# Patient Record
Sex: Female | Born: 1995 | Race: White | Hispanic: No | Marital: Single | Smoking: Never smoker
Health system: Southern US, Community
[De-identification: ages and names within clinical notes are randomized; demographics above are authoritative.]

## PROBLEM LIST (undated history)

## (undated) ENCOUNTER — Inpatient Hospital Stay (HOSPITAL_COMMUNITY): Payer: Self-pay

## (undated) DIAGNOSIS — R011 Cardiac murmur, unspecified: Secondary | ICD-10-CM

## (undated) DIAGNOSIS — M7070 Other bursitis of hip, unspecified hip: Secondary | ICD-10-CM

## (undated) DIAGNOSIS — D649 Anemia, unspecified: Secondary | ICD-10-CM

## (undated) HISTORY — PX: TONSILLECTOMY: SUR1361

## (undated) HISTORY — DX: Cardiac murmur, unspecified: R01.1

## (undated) HISTORY — DX: Anemia, unspecified: D64.9

## (undated) HISTORY — DX: Other bursitis of hip, unspecified hip: M70.70

---
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
## 2016-07-04 DIAGNOSIS — Z86718 Personal history of other venous thrombosis and embolism: Secondary | ICD-10-CM | POA: Insufficient documentation

## 2020-04-16 ENCOUNTER — Encounter (HOSPITAL_COMMUNITY): Payer: Self-pay

## 2020-04-16 ENCOUNTER — Other Ambulatory Visit: Payer: Self-pay

## 2020-04-16 ENCOUNTER — Emergency Department (HOSPITAL_COMMUNITY): Payer: Medicaid Other

## 2020-04-16 DIAGNOSIS — R0600 Dyspnea, unspecified: Secondary | ICD-10-CM | POA: Diagnosis not present

## 2020-04-16 DIAGNOSIS — R002 Palpitations: Secondary | ICD-10-CM | POA: Insufficient documentation

## 2020-04-16 DIAGNOSIS — R079 Chest pain, unspecified: Secondary | ICD-10-CM | POA: Diagnosis not present

## 2020-04-16 LAB — I-STAT BETA HCG BLOOD, ED (NOT ORDERABLE): I-stat hCG, quantitative: <5 m[IU]/mL (ref <5)

## 2020-04-16 NOTE — ED Triage Notes (Signed)
Pt is post partum 6wks. Last week pt began feeling dizzines, lightheaded, and chest heaviness, and palpations. Pt went to urgent care and was sent here.

## 2020-04-17 ENCOUNTER — Emergency Department (HOSPITAL_COMMUNITY)
Admission: EM | Admit: 2020-04-17 | Discharge: 2020-04-17 | Disposition: A | Discharge status: Home / Self Care | Payer: Medicaid Other | Attending: Emergency Medicine | Admitting: Emergency Medicine

## 2020-04-17 DIAGNOSIS — R0789 Other chest pain: Secondary | ICD-10-CM

## 2020-04-17 LAB — CBC
HCT: 41.9 % (ref 36.0–46.0)
Hemoglobin: 12.7 g/dL (ref 12.0–15.0)
MCH: 26.4 pg (ref 26.0–34.0)
MCHC: 30.3 g/dL (ref 30.0–36.0)
MCV: 87.1 fL (ref 80.0–100.0)
Platelets: 222 10*3/uL (ref 150–400)
RBC: 4.81 MIL/uL (ref 3.87–5.11)
RDW: 15.1 % (ref 11.5–15.5)
WBC: 7.9 10*3/uL (ref 4.0–10.5)
nRBC: 0 % (ref 0.0–0.2)

## 2020-04-17 LAB — BASIC METABOLIC PANEL
Anion gap: 11 (ref 5–15)
BUN: 7 mg/dL (ref 6–20)
CO2: 25 mmol/L (ref 22–32)
Calcium: 9.8 mg/dL (ref 8.9–10.3)
Chloride: 104 mmol/L (ref 98–111)
Creatinine, Ser: 0.63 mg/dL (ref 0.44–1.00)
GFR calc Af Amer: >60 mL/min (ref >60)
GFR calc non Af Amer: >60 mL/min (ref >60)
Glucose, Bld: 98 mg/dL (ref 70–99)
Potassium: 3.9 mmol/L (ref 3.5–5.1)
Sodium: 140 mmol/L (ref 135–145)

## 2020-04-17 LAB — TROPONIN I (HIGH SENSITIVITY): Troponin I (High Sensitivity): 5 ng/L (ref <18)

## 2020-04-17 MED ORDER — ALBUTEROL SULFATE HFA 108 (90 BASE) MCG/ACT IN AERS
2.0000 | INHALATION_SPRAY | RESPIRATORY_TRACT | Status: DC
  Administered 2020-04-17: 2 via RESPIRATORY_TRACT
  Filled 2020-04-17: qty 6.7

## 2020-04-17 NOTE — Discharge Instructions (Addendum)
Take 1 to 2 puffs of the inhaler every 4-6 hours as needed for any trouble breathing

## 2020-04-17 NOTE — ED Provider Notes (Signed)
[facility]-EMERGENCY DEPT Provider Note   CSN: [id number] Arrival date & time: 04/16/20  1921     History Chief Complaint  Patient presents with  . Chest Pain    Marilyn Howard is a 24 y.o. female.  24 year old female presents with several week history of chest discomfort.  Chest discomfort wax and wanes and is positional.  Has been associate with palpitations at times.  Denies any associated leg pain or swelling.  No recent fever or chills.  No cough or congestion.  Went to urgent care and that note was reviewed.  Patient is does note some increased anxiety when she has the symptoms.  Patient also notes that she at times has trouble breathing and does have a history of vape use as well as tobacco.  States that she use inhaler one time which did not make her symptoms better.        History reviewed. No pertinent past medical history.  There are no problems to display for this patient.   History reviewed. No pertinent surgical history.   OB History   No obstetric history on file.     No family history on file.  Social History   Tobacco Use  . Smoking status: Not on file  Substance Use Topics  . Alcohol use: Not on file  . Drug use: Not on file    Home Medications Prior to Admission medications   Not on File    Allergies    Patient has no allergy information on record.  Review of Systems   Review of Systems  All other systems reviewed and are negative.   Physical Exam Updated Vital Signs BP 122/73 (BP Location: Right Arm)   Pulse 75   Temp 98.5 F (36.9 C) (Oral)   Resp 18   Ht 1.702 m (5\' 7" )   Wt 81.6 kg   LMP 04/02/2020   SpO2 100%   BMI 28.19 kg/m   Physical Exam Vitals and nursing note reviewed.  Constitutional:      General: She is not in acute distress.    Appearance: Normal appearance. She is well-developed. She is not toxic-appearing.  HENT:     Head: Normocephalic and atraumatic.  Eyes:     General: Lids are  normal.     Conjunctiva/sclera: Conjunctivae normal.     Pupils: Pupils are equal, round, and reactive to light.  Neck:     Thyroid: No thyroid mass.     Trachea: No tracheal deviation.  Cardiovascular:     Rate and Rhythm: Normal rate and regular rhythm.     Heart sounds: Normal heart sounds. No murmur heard.  No gallop.   Pulmonary:     Effort: Pulmonary effort is normal. No respiratory distress.     Breath sounds: Normal breath sounds. No stridor. No decreased breath sounds, wheezing, rhonchi or rales.  Abdominal:     General: Bowel sounds are normal. There is no distension.     Palpations: Abdomen is soft.     Tenderness: There is no abdominal tenderness. There is no rebound.  Musculoskeletal:        General: No tenderness. Normal range of motion.     Cervical back: Normal range of motion and neck supple.  Skin:    General: Skin is warm and dry.     Findings: No abrasion or rash.  Neurological:     Mental Status: She is alert and oriented to person, place, and time.  GCS: GCS eye subscore is 4. GCS verbal subscore is 5. GCS motor subscore is 6.     Cranial Nerves: No cranial nerve deficit.     Sensory: No sensory deficit.  Psychiatric:        Speech: Speech normal.        Behavior: Behavior normal.     ED Results / Procedures / Treatments   Labs (all labs ordered are listed, but only abnormal results are displayed) Labs Reviewed  BASIC METABOLIC PANEL  CBC  I-STAT BETA HCG BLOOD, ED ([facility], [facility], [facility] ONLY)  I-STAT BETA HCG BLOOD, ED (NOT ORDERABLE)  TROPONIN I (HIGH SENSITIVITY)  TROPONIN I (HIGH SENSITIVITY)    EKG EKG Interpretation  Date/Time:  Monday April 16 2020 20:53:38 EDT Ventricular Rate:  91 PR Interval:  152 QRS Duration: 82 QT Interval:  354 QTC Calculation: 435 R Axis:   82 Text Interpretation: Normal sinus rhythm Normal ECG Confirmed by Lorre Nick ([id number]) on 04/17/2020 8:35:41 AM   Radiology DG Chest 2 View  Result Date:  04/16/2020 CLINICAL DATA:  Mid chest pain. Heart palpitations. Shortness of breath. Dizziness. The symptoms have been intermittent over the last year and worsening over the past week. EXAM: CHEST - 2 VIEW COMPARISON:  None. FINDINGS: The heart size and mediastinal contours are within normal limits. Both lungs are clear. The visualized skeletal structures are unremarkable. IMPRESSION: Normal examination. Electronically Signed   By: Beckie Salts M.D.   On: 04/16/2020 21:29    Procedures Procedures (including critical care time)  Medications Ordered in ED Medications - No data to display  ED Course  I have reviewed the triage vital signs and the nursing notes.  Pertinent labs & imaging results that were available during my care of the patient were reviewed by me and considered in my medical decision making (see chart for details).    MDM Rules/Calculators/A&P                          Patient's evaluation here including EKG, chest x-rays, labs are within normal limits.  Patient is requesting butyryl inhaler which I will provide.  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  low suspicion for ACS or PE.   Return precautions given   Final Clinical Impression(s) / ED Diagnoses Final diagnoses:  None    Rx / DC Orders ED Discharge Orders    None       Lorre Nick, MD 04/17/20 [phone]

## 2020-04-17 NOTE — ED Notes (Signed)
Called main lab to inquire about blood sent down at 2100 hours that was not in process Main lab found blood and will start processing labs

## 2020-05-04 DIAGNOSIS — F411 Generalized anxiety disorder: Secondary | ICD-10-CM | POA: Insufficient documentation

## 2021-09-05 IMAGING — CR DG CHEST 2V
2 series · 2 of 2 positions shown · non-contrast
Comparison: None.

CLINICAL DATA: Mid chest pain. Heart palpitations. Shortness of
breath. Dizziness. The symptoms have been intermittent over the last
year and worsening over the past week.

EXAM:
CHEST - 2 VIEW

[w chest pa]
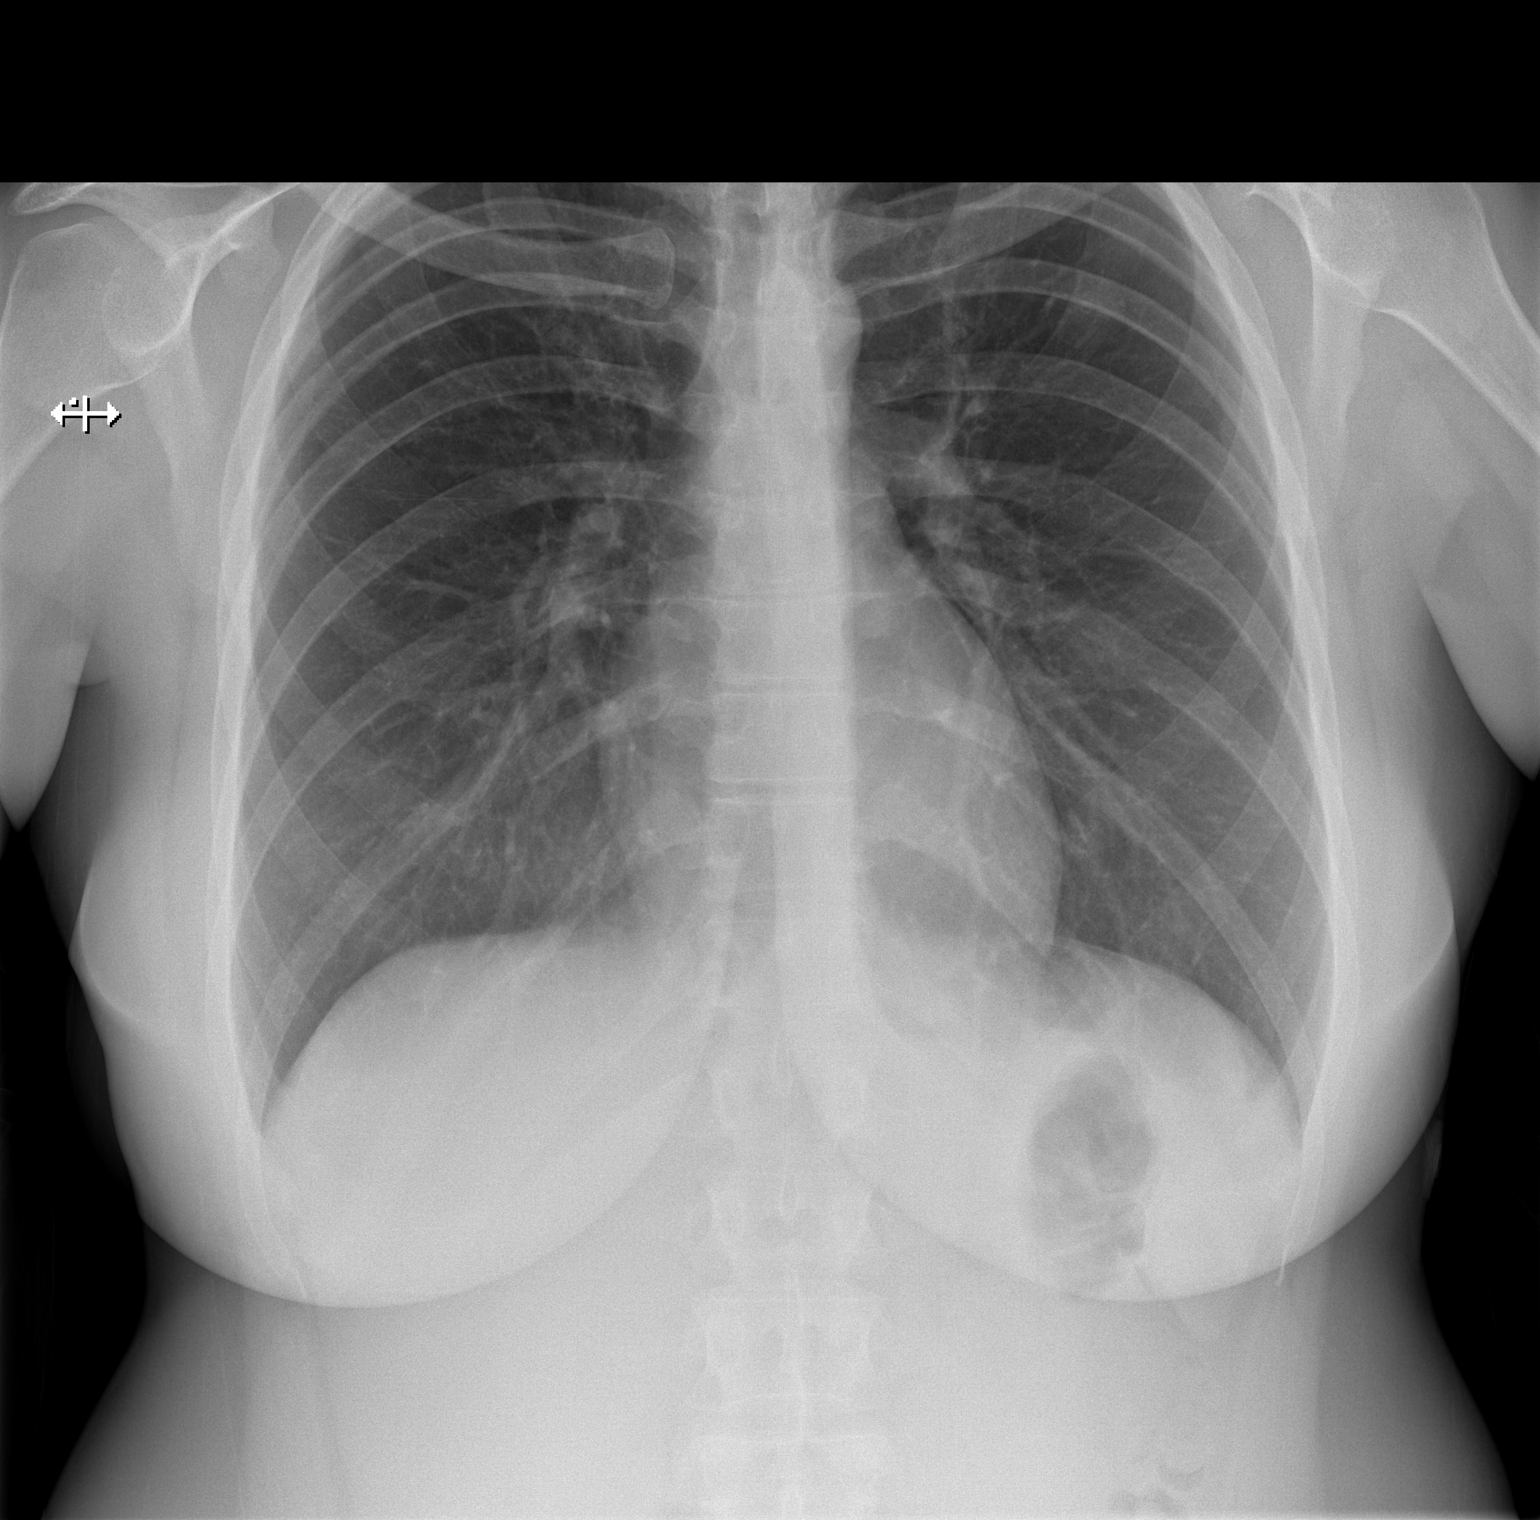

[w chest lat]
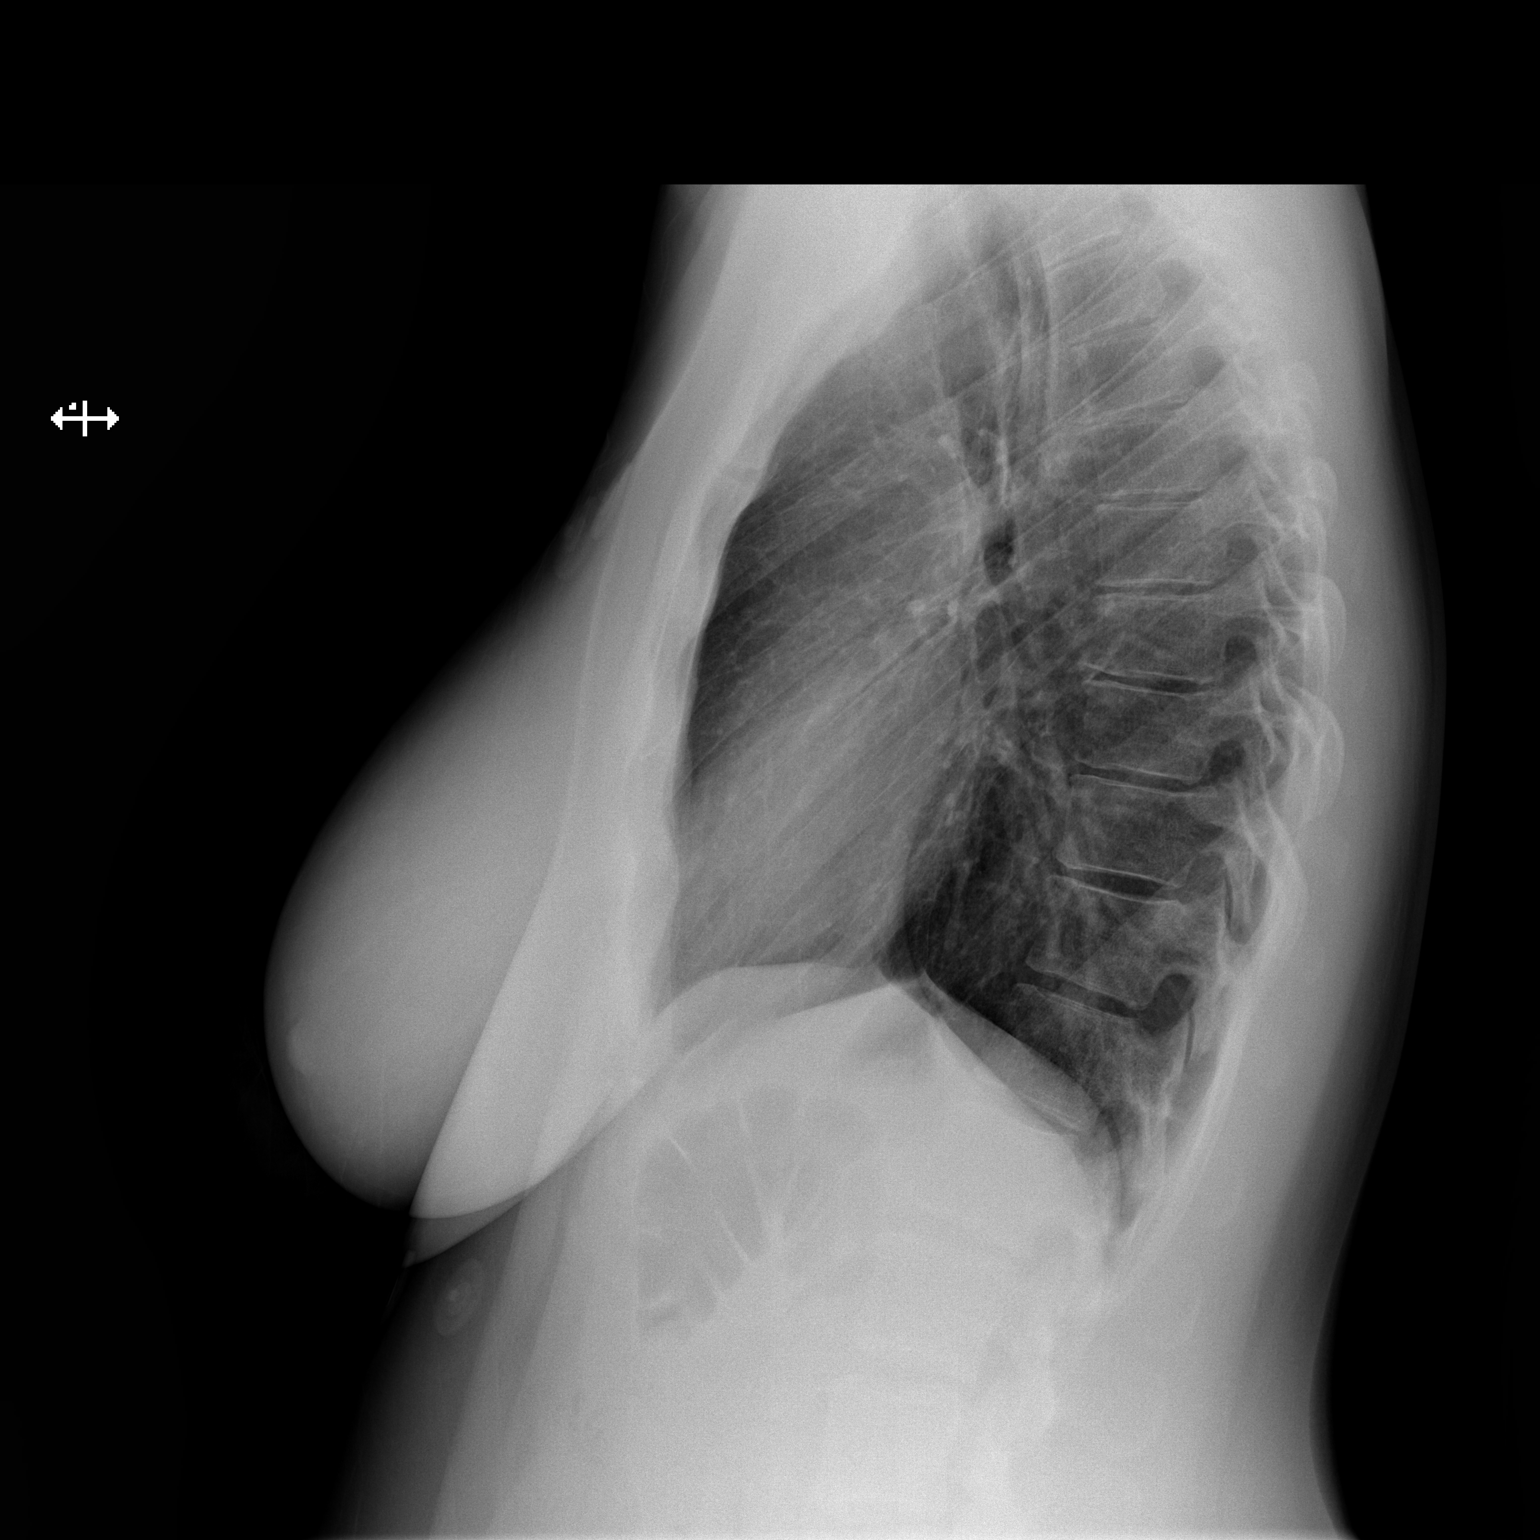

[2 of 2 positions shown; findings below may reference images not displayed]

FINDINGS: The heart size and mediastinal contours are within normal limits.
Both lungs are clear. The visualized skeletal structures are
unremarkable.
IMPRESSION: Normal examination.

## 2022-02-24 DIAGNOSIS — G4710 Hypersomnia, unspecified: Secondary | ICD-10-CM | POA: Insufficient documentation

## 2022-02-24 DIAGNOSIS — E559 Vitamin D deficiency, unspecified: Secondary | ICD-10-CM | POA: Insufficient documentation

## 2022-09-21 ENCOUNTER — Other Ambulatory Visit: Payer: Self-pay

## 2022-09-21 ENCOUNTER — Emergency Department (HOSPITAL_COMMUNITY): Payer: Medicaid Other

## 2022-09-21 ENCOUNTER — Emergency Department (HOSPITAL_COMMUNITY)
Admission: EM | Admit: 2022-09-21 | Discharge: 2022-09-21 | Disposition: A | Discharge status: Home / Self Care | Payer: Medicaid Other | Attending: Emergency Medicine | Admitting: Emergency Medicine

## 2022-09-21 DIAGNOSIS — W01198A Fall on same level from slipping, tripping and stumbling with subsequent striking against other object, initial encounter: Secondary | ICD-10-CM | POA: Insufficient documentation

## 2022-09-21 DIAGNOSIS — S0502XA Injury of conjunctiva and corneal abrasion without foreign body, left eye, initial encounter: Secondary | ICD-10-CM | POA: Diagnosis not present

## 2022-09-21 DIAGNOSIS — S82142A Displaced bicondylar fracture of left tibia, initial encounter for closed fracture: Secondary | ICD-10-CM | POA: Diagnosis not present

## 2022-09-21 DIAGNOSIS — S8992XA Unspecified injury of left lower leg, initial encounter: Secondary | ICD-10-CM | POA: Diagnosis present

## 2022-09-21 MED ORDER — KETOROLAC TROMETHAMINE 30 MG/ML IJ SOLN
30.0000 mg | Freq: Once | INTRAMUSCULAR | Status: AC
  Administered 2022-09-21: 30 mg via INTRAMUSCULAR
  Filled 2022-09-21: qty 1

## 2022-09-21 MED ORDER — OXYCODONE-ACETAMINOPHEN 5-325 MG PO TABS: 1.0000 | ORAL_TABLET | Freq: Four times a day (QID) | ORAL | 0 refills | Status: DC | PRN

## 2022-09-21 MED ORDER — OXYCODONE-ACETAMINOPHEN 5-325 MG PO TABS: 1.0000 | ORAL_TABLET | ORAL | 0 refills | Status: DC | PRN

## 2022-09-21 MED ORDER — OXYCODONE-ACETAMINOPHEN 5-325 MG PO TABS
1.0000 | ORAL_TABLET | Freq: Once | ORAL | Status: AC
  Administered 2022-09-21: 1 via ORAL
  Filled 2022-09-21: qty 1

## 2022-09-21 NOTE — ED Provider Notes (Signed)
[facility] Provider Note   CSN: [id number] Arrival date & time: 09/21/22  0009     History  Chief Complaint  Patient presents with   Knee Injury    Marilyn Howard is a 27 y.o. female.  Patient presents to the emergency department for evaluation of left knee injury after a fall.  Patient reports that she slipped and her left knee buckled up underneath her.  She is having severe pain in the knee, especially when she tries to bear weight.  She did hit her head and has a scratch on her left eyebrow but no loss of consciousness.  No headache, neck pain, back pain.       Home Medications Prior to Admission medications   Medication Sig Start Date End Date Taking? Authorizing Provider  oxyCODONE-acetaminophen (PERCOCET) 5-325 MG tablet Take 1 tablet by mouth every 4 (four) hours as needed for severe pain. 09/21/22  Yes Samarrah Tranchina, Gwenyth Allegra, MD  oxyCODONE-acetaminophen (PERCOCET/ROXICET) 5-325 MG tablet Take 1 tablet by mouth every 6 (six) hours as needed for severe pain. 09/21/22  Yes Marleena Shubert, Gwenyth Allegra, MD      Allergies    Patient has no allergy information on record.    Review of Systems   Review of Systems  Physical Exam Updated Vital Signs BP 125/70   Pulse 85   Temp 98.1 F (36.7 C) (Oral)   Resp 18   Ht 5\' 7"  (1.702 m)   Wt 81 kg   SpO2 100%   BMI 27.97 kg/m  Physical Exam Vitals and nursing note reviewed.  Constitutional:      General: She is not in acute distress.    Appearance: She is well-developed.  HENT:     Head: Normocephalic. Abrasion (Superficial, small left eyebrow) present.     Mouth/Throat:     Mouth: Mucous membranes are moist.  Eyes:     General: Vision grossly intact. Gaze aligned appropriately.     Extraocular Movements: Extraocular movements intact.     Conjunctiva/sclera: Conjunctivae normal.  Cardiovascular:     Rate and Rhythm: Normal rate and regular rhythm.     Pulses: Normal pulses.     Heart sounds:  Normal heart sounds, S1 normal and S2 normal. No murmur heard.    No friction rub. No gallop.  Pulmonary:     Effort: Pulmonary effort is normal. No respiratory distress.     Breath sounds: Normal breath sounds.  Abdominal:     General: Bowel sounds are normal.     Palpations: Abdomen is soft.     Tenderness: There is no abdominal tenderness. There is no guarding or rebound.     Hernia: No hernia is present.  Musculoskeletal:        General: No swelling.     Cervical back: Full passive range of motion without pain, normal range of motion and neck supple. No spinous process tenderness or muscular tenderness. Normal range of motion.     Left knee: Effusion present. Decreased range of motion. Tenderness present.     Right lower leg: No edema.     Left lower leg: No edema.  Skin:    General: Skin is warm and dry.     Capillary Refill: Capillary refill takes less than 2 seconds.     Findings: No ecchymosis, erythema, rash or wound.  Neurological:     General: No focal deficit present.     Mental Status: She is alert and oriented to person, place,  and time.     GCS: GCS eye subscore is 4. GCS verbal subscore is 5. GCS motor subscore is 6.     Cranial Nerves: Cranial nerves 2-12 are intact.     Sensory: Sensation is intact.     Motor: Motor function is intact.     Coordination: Coordination is intact.  Psychiatric:        Attention and Perception: Attention normal.        Mood and Affect: Mood normal.        Speech: Speech normal.        Behavior: Behavior normal.     ED Results / Procedures / Treatments   Labs (all labs ordered are listed, but only abnormal results are displayed) Labs Reviewed - No data to display  EKG None  Radiology CT Knee Left Wo Contrast  Result Date: 09/21/2022 CLINICAL DATA:  Tibial plateau fracture.  Fall injury. EXAM: CT OF THE LEFT KNEE WITHOUT CONTRAST TECHNIQUE: Multidetector CT imaging of the left knee was performed according to the standard  protocol. Multiplanar CT image reconstructions were also generated. RADIATION DOSE REDUCTION: This exam was performed according to the departmental dose-optimization program which includes automated exposure control, adjustment of the mA and/or kV according to patient size and/or use of iterative reconstruction technique. COMPARISON:  Left knee series earlier today. FINDINGS: Bones/Joint/Cartilage There is normal bone mineralization. There is a slightly depressed roughly ovoid shaped acute fracture fragment of the central to lateral aspect of the lateral tibial plateau, depressed no more than 2 mm from the plane of the articular surface. The fracture fragment measures 2.1 cm coronal, 2 cm AP, and 1 cm in height and there is no fracture line exiting the metaphyseal cortex extending from the bed of this fracture fragment. There are no further fractures. There is a moderate lipohemarthrosis which is predominantly suprapatellar in location. There is no popliteal cyst. Arthritic changes are not seen. Ligaments Suboptimally assessed by CT. Muscles and Tendons The extensor mechanism is intact. No muscular hematoma is seen. There is normal muscle bulk. Soft tissues There is no soft tissue gas.  No soft tissue mass is seen No surface hematoma. IMPRESSION: 1. Slightly depressed fracture fragment of the lateral tibial plateau with 2 mm depression. 2. Moderate lipohemarthrosis. Electronically Signed   By: Almira Bar M.D.   On: 09/21/2022 02:15   DG Knee Complete 4 Views Left  Result Date: 09/21/2022 CLINICAL DATA:  Left knee pain, fall EXAM: LEFT KNEE - COMPLETE 4+ VIEW COMPARISON:  None Available. FINDINGS: Joint effusion with lipohemarthrosis. There is irregularity in the lateral tibial plateau concerning for fracture. Joint spaces maintained. No subluxation or dislocation. IMPRESSION: Concern for nondisplaced left lateral tibial plateau fracture with lipohemarthrosis. This could be further evaluated with CT.  Electronically Signed   By: Charlett Nose M.D.   On: 09/21/2022 01:15    Procedures Procedures    Medications Ordered in ED Medications  oxyCODONE-acetaminophen (PERCOCET/ROXICET) 5-325 MG per tablet 1 tablet (1 tablet Oral Given 09/21/22 0050)  ketorolac (TORADOL) 30 MG/ML injection 30 mg (30 mg Intramuscular Given 09/21/22 0251)    ED Course/ Medical Decision Making/ A&P                             Medical Decision Making Amount and/or Complexity of Data Reviewed Radiology: ordered and independent interpretation performed. Decision-making details documented in ED Course.  Risk Prescription drug management.   Resents to the  emergency department for evaluation of left knee injury after a fall.  Patient with swelling and palpable joint effusion on exam.  No clear deformity.  X-ray raised concern for possible tibial plateau fracture.  CT scan confirms lateral tibial plateau fracture with minimal, 2 mm depression.  Compartments are soft.  Normal neurologic function distally, distal pulses are palpable.  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  She is no longer on OCP.  Patient discussed with Dr. Lyla Glassing, on-call for Ortho.  No aspirin or blood thinners at this time.  This could lead to increased risk for compartment syndrome.  Knee immobilizer, nonweightbearing status.  Follow-up in the office this week.  Signs and symptoms of compartment syndrome discussed with the patient extensively, will return if any occur.        Final Clinical Impression(s) / ED Diagnoses Final diagnoses:  Closed fracture of left tibial plateau, initial encounter    Rx / DC Orders ED Discharge Orders          Ordered    oxyCODONE-acetaminophen (PERCOCET/ROXICET) 5-325 MG tablet  Every 6 hours PRN        09/21/22 0344    oxyCODONE-acetaminophen (PERCOCET) 5-325 MG tablet  Every 4 hours PRN        09/21/22 0346              Orpah Greek, MD 09/21/22 [phone]

## 2022-09-21 NOTE — ED Notes (Signed)
Pt given water per request.

## 2022-09-21 NOTE — ED Triage Notes (Signed)
Pt c/o left knee pain after fall. Dr. Betsey Holiday at bedside

## 2022-09-21 NOTE — ED Notes (Signed)
Patient transported to X-ray 

## 2022-09-22 MED FILL — Oxycodone w/ Acetaminophen Tab 5-325 MG: ORAL | Qty: 6 | Status: AC

## 2022-09-23 ENCOUNTER — Ambulatory Visit: Payer: Self-pay | Admitting: Orthopedic Surgery

## 2022-11-21 ENCOUNTER — Ambulatory Visit (HOSPITAL_COMMUNITY): Payer: Medicaid Other | Attending: Orthopedic Surgery

## 2022-11-21 ENCOUNTER — Encounter (HOSPITAL_COMMUNITY): Payer: Self-pay

## 2022-11-21 ENCOUNTER — Other Ambulatory Visit: Payer: Self-pay

## 2022-11-21 DIAGNOSIS — G8929 Other chronic pain: Secondary | ICD-10-CM | POA: Diagnosis present

## 2022-11-21 DIAGNOSIS — R262 Difficulty in walking, not elsewhere classified: Secondary | ICD-10-CM | POA: Diagnosis present

## 2022-11-21 DIAGNOSIS — M25662 Stiffness of left knee, not elsewhere classified: Secondary | ICD-10-CM | POA: Insufficient documentation

## 2022-11-21 DIAGNOSIS — M25562 Pain in left knee: Secondary | ICD-10-CM | POA: Insufficient documentation

## 2022-11-21 NOTE — Therapy (Signed)
OUTPATIENT PHYSICAL THERAPY LOWER EXTREMITY EVALUATION   Patient Name: Marilyn Howard MRN: [id number] DOB:[birth year], 27 y.o., female Today's Date: 11/21/2022  END OF SESSION:  PT End of Session - 11/21/22 1634     Visit Number 1    Number of Visits 8    Date for PT Re-Evaluation 12/19/22    Authorization Type [place] [employer] (requested 8 visits)    PT Start Time 1305    PT Stop Time 1350    PT Time Calculation (min) 45 min    Activity Tolerance Patient tolerated treatment well    Behavior During Therapy WFL for tasks assessed/performed            Past Medical History:  Diagnosis Date   Hip bursitis    History reviewed. No pertinent surgical history. There are no problems to display for this patient.  PCP: Rod Can, MD  REFERRING PROVIDER: Rod Can, MD  REFERRING DIAG: fracture of tibial plateau  THERAPY DIAG:  Chronic pain of left knee  Stiffness of knee joint, left  Difficulty in walking, not elsewhere classified  Rationale for Evaluation and Treatment: Rehabilitation  ONSET DATE: September 21, 2022  SUBJECTIVE:   SUBJECTIVE STATEMENT: Arrives to the clinic with c/o pain on the L foot (outer side),  medial aspect of L knee and low back. Also c/o difficulty with straightening the knee. Patient slipped and fell on a wet grass on September 21, 2022 which broke her L knee. Patient went to the ER. X-ray and CT were done which revealed fracture of the L tibial plateau. Patient was in a knee immobilizer for 1.5 weeks and was initially NWB for 6 weeks. Patient was then transitioned to a hinged knee brace. When she started to bear weight 2 weeks ago, she was allowed to have 50% weight bearing with crutches. The following week, patient was allowed to be in FWB with B crutches then transitioned to one crutch. Per patient, recent X-ray revealed good healing and patient was then referred to outpatient PT evaluation and management. Patient reports that she was  still advised by her physician to use a hinged-brace till December 15, 2022 when standing/walking  PERTINENT HISTORY: L hip bursitis PAIN:  Are you having pain? Yes: NPRS scale: 6/10 Pain location: L foot and medial L knee and low back Pain description: aching Aggravating factors: walking Relieving factors: Ibuprofen  PRECAUTIONS: Other: needs to use the hinged knee brace on ambulation/standing till 12/15/2022  WEIGHT BEARING RESTRICTIONS: No  FALLS:  Has patient fallen in last 6 months? Yes. Number of falls 1  LIVING ENVIRONMENT: Lives with: lives with their partner and lives with their daughter Lives in: House/apartment Stairs: No Has following equipment at home: Crutches and hinged knee brace  OCCUPATION: aesthetician  PLOF: Independent  PATIENT GOALS: "strengthen the knee, retrain the brain how to walk with B legs, no more pain"  NEXT MD VISIT: December 15, 2022  OBJECTIVE:   DIAGNOSTIC FINDINGS:  Narrative & Impression  CT OF THE LEFT KNEE WITHOUT CONTRAST 09/21/2022   TECHNIQUE: Multidetector CT imaging of the left knee was performed according to the standard protocol. Multiplanar CT image reconstructions were also generated.   RADIATION DOSE REDUCTION: This exam was performed according to the departmental dose-optimization program which includes automated exposure control, adjustment of the mA and/or kV according to patient size and/or use of iterative reconstruction technique.   COMPARISON:  Left knee series earlier today.   FINDINGS: Bones/Joint/Cartilage   There is normal bone  mineralization. There is a slightly depressed roughly ovoid shaped acute fracture fragment of the central to lateral aspect of the lateral tibial plateau, depressed no more than 2 mm from the plane of the articular surface.   The fracture fragment measures 2.1 cm coronal, 2 cm AP, and 1 cm in height and there is no fracture line exiting the metaphyseal cortex extending from the bed  of this fracture fragment.   There are no further fractures. There is a moderate lipohemarthrosis which is predominantly suprapatellar in location. There is no popliteal cyst. Arthritic changes are not seen.   Ligaments   Suboptimally assessed by CT.   Muscles and Tendons   The extensor mechanism is intact. No muscular hematoma is seen. There is normal muscle bulk.   Soft tissues   There is no soft tissue gas.  No soft tissue mass is seen   No surface hematoma.   IMPRESSION: 1. Slightly depressed fracture fragment of the lateral tibial plateau with 2 mm depression. 2. Moderate lipohemarthrosis.  L knee X-ray 09/21/2022 LEFT KNEE - COMPLETE 4+ VIEW   COMPARISON:  None Available.   FINDINGS: Joint effusion with lipohemarthrosis. There is irregularity in the lateral tibial plateau concerning for fracture. Joint spaces maintained. No subluxation or dislocation.   IMPRESSION: Concern for nondisplaced left lateral tibial plateau fracture with lipohemarthrosis. This could be further evaluated with CT.    PATIENT SURVEYS:  LEFS 34/80 = 42.5%  COGNITION: Overall cognitive status: Within functional limits for tasks assessed     SENSATION: Not tested  EDEMA: none observed   MUSCLE LENGTH: Mild restriction on B hamstrings and gastrocsoleus Moderate restriction on B quads Moderate restriction on B piriformis  POSTURE:  supinated feet in standing  PALPATION: No tenderness on major bony landmarks of the B knees  LOWER EXTREMITY ROM:  Active ROM Right eval Left eval  Hip flexion [facility]  Hip extension [facility]  Hip abduction [facility]  Hip adduction    Hip internal rotation    Hip external rotation    Knee flexion 140 140*  Knee extension 0 0  Ankle dorsiflexion [facility] WFL  Ankle plantarflexion [facility] WFL  Ankle inversion    Ankle eversion     (Blank rows = not tested) *knee flexion only at 110 degrees if patient is in prone  LOWER EXTREMITY MMT:  MMT  Right eval Left eval  Hip flexion 4 4  Hip extension 4 4  Hip abduction 5 5  Hip adduction 4 4  Hip internal rotation    Hip external rotation    Knee flexion 5 4-  Knee extension 5 4-  Ankle dorsiflexion 5 5  Ankle plantarflexion 5 5  Ankle inversion    Ankle eversion     (Blank rows = not tested)  FUNCTIONAL TESTS: (done on hinged brace) 5 times sit to stand: 7.45 sec 2 minute walk test: 446 ft SLS, eyes open, firm surface: R = 16.67 sec, L = 2.38 sec  GAIT: Distance walked: 446 ft Assistive device utilized: None Level of assistance: Complete Independence Comments: little to no antalgic gait seen, done with hinged brace reported of L foot pain on the lateral border   TODAY'S TREATMENT:  DATE:  11/21/2022  Evaluation and patient education done   PATIENT EDUCATION:  Education details: Educated on the pathoanatomy of tibial plateau fx. Educated on the goals and course of rehab.  Person educated: Patient Education method: Explanation Education comprehension: verbalized understanding  HOME EXERCISE PROGRAM: None provided to date  ASSESSMENT:  CLINICAL IMPRESSION: Patient is a 27 y.o. female who was seen today for physical therapy evaluation and treatment for L tibial plateau fx. Patient was diagnosed with L tibial plateau fx by referring provider further defined by difficulty with walking due to pain, weakness, and decreased soft tissue extensibility. Skilled PT is required to address the impairments and functional limitations listed below.  OBJECTIVE IMPAIRMENTS: decreased activity tolerance, difficulty walking, decreased strength, impaired flexibility, and pain.   ACTIVITY LIMITATIONS: standing, squatting, and stairs  PARTICIPATION LIMITATIONS: cleaning, laundry, community activity, and occupation  PERSONAL FACTORS: Time since onset of  injury/illness/exacerbation are also affecting patient's functional outcome.   REHAB POTENTIAL: Good  CLINICAL DECISION MAKING: Stable/uncomplicated  EVALUATION COMPLEXITY: Low   GOALS: Goals reviewed with patient? Yes  SHORT TERM GOALS: Target date: 12/05/2022 Pt will demonstrate indep in HEP to facilitate carry-over of skilled services and improve functional outcomes Goal status: INITIAL   LONG TERM GOALS: Target date: 12/19/2022  Pt will increase LEFS by at least 27 points in order to demonstrate significant improvement in lower extremity function.  Baseline: 34/80 Goal status: INITIAL  2.  Pt will increase 2MWT by at least 80 ft in order to demonstrate clinically significant improvement in community ambulation Baseline: 446 ft Goal status: INITIAL  3.  Pt will demonstrate increase in LE strength to 5 to facilitate ease and safety in ambulation Baseline: 4- Goal status: INITIAL  4.  Pt will demonstrate increase in L SLS on firm surface with eyes open to 17 sec to facilitate safety in ambulation Baseline: 2.38 sec Goal status: INITIAL  PLAN:  PT FREQUENCY: 2x/week  PT DURATION: 4 weeks  PLANNED INTERVENTIONS: Therapeutic exercises, Therapeutic activity, Neuromuscular re-education, Balance training, Gait training, Patient/Family education, Self Care, and Manual therapy  PLAN FOR NEXT SESSION: My begin LE flexibility, strengthening, balance, and gait training.   Harvie Heck. Tatanisha Cuthbert, PT, DPT, OCS Board-Certified Clinical Specialist in [place] # ([place]): [phone] T 11/21/2022, 4:49 PM

## 2022-11-26 ENCOUNTER — Ambulatory Visit (HOSPITAL_COMMUNITY): Payer: Medicaid Other

## 2022-11-26 DIAGNOSIS — M25562 Pain in left knee: Secondary | ICD-10-CM | POA: Diagnosis not present

## 2022-11-26 DIAGNOSIS — M25662 Stiffness of left knee, not elsewhere classified: Secondary | ICD-10-CM

## 2022-11-26 DIAGNOSIS — R262 Difficulty in walking, not elsewhere classified: Secondary | ICD-10-CM

## 2022-11-26 DIAGNOSIS — G8929 Other chronic pain: Secondary | ICD-10-CM

## 2022-11-26 NOTE — Therapy (Signed)
OUTPATIENT PHYSICAL THERAPY LOWER EXTREMITY TREATMENT   Patient Name: Marilyn Howard MRN: [id number] DOB:Apr 18, 1996, 27 y.o., female Today's Date: 11/26/2022  END OF SESSION:  PT End of Session - 11/26/22 1348     Visit Number 2    Number of Visits 8    Date for PT Re-Evaluation 12/19/22    Authorization Type [place] Medicaid [employer]    Authorization Time Period 11/21/22-01/20/23    Authorization - Visit Number 1    Authorization - Number of Visits 8    PT Start Time 7893    PT Stop Time 1425    PT Time Calculation (min) 40 min    Activity Tolerance Patient tolerated treatment well    Behavior During Therapy WFL for tasks assessed/performed            Past Medical History:  Diagnosis Date   Hip bursitis    No past surgical history on file. There are no problems to display for this patient.  PCP: Rod Can, MD  REFERRING PROVIDER: Rod Can, MD  REFERRING DIAG: fracture of tibial plateau  THERAPY DIAG:  Chronic pain of left knee  Stiffness of knee joint, left  Difficulty in walking, not elsewhere classified  Rationale for Evaluation and Treatment: Rehabilitation  ONSET DATE: September 21, 2022  SUBJECTIVE:   SUBJECTIVE STATEMENT: Arrives to the clinic without her knee brace on. Patient states that she takes it off when she drives. Patient states that she has pain on top of the L ankle and at the back of the L knee = 2/10.  EVAL: Arrives to the clinic with c/o pain on the L foot (outer side),  medial aspect of L knee and low back. Also c/o difficulty with straightening the knee. Patient slipped and fell on a wet grass on September 21, 2022 which broke her L knee. Patient went to the ER. X-ray and CT were done which revealed fracture of the L tibial plateau. Patient was in a knee immobilizer for 1.5 weeks and was initially NWB for 6 weeks. Patient was then transitioned to a hinged knee brace. When she started to bear weight 2 weeks ago, she was allowed to have  50% weight bearing with crutches. The following week, patient was allowed to be in FWB with B crutches then transitioned to one crutch. Per patient, recent X-ray revealed good healing and patient was then referred to outpatient PT evaluation and management. Patient reports that she was still advised by her physician to use a hinged-brace till December 15, 2022 when standing/walking  PERTINENT HISTORY: L hip bursitis PAIN:  Are you having pain? Yes: NPRS scale: 6/10 Pain location: L foot and medial L knee and low back Pain description: aching Aggravating factors: walking Relieving factors: Ibuprofen  PRECAUTIONS: Other: needs to use the hinged knee brace on ambulation/standing till 12/15/2022  WEIGHT BEARING RESTRICTIONS: No  FALLS:  Has patient fallen in last 6 months? Yes. Number of falls 1  LIVING ENVIRONMENT: Lives with: lives with their partner and lives with their daughter Lives in: House/apartment Stairs: No Has following equipment at home: Crutches and hinged knee brace  OCCUPATION: aesthetician  PLOF: Independent  PATIENT GOALS: "strengthen the knee, retrain the brain how to walk with B legs, no more pain"  NEXT MD VISIT: December 15, 2022  OBJECTIVE:   DIAGNOSTIC FINDINGS:  Narrative & Impression  CT OF THE LEFT KNEE WITHOUT CONTRAST 09/21/2022   TECHNIQUE: Multidetector CT imaging of the left knee was performed according to the  standard protocol. Multiplanar CT image reconstructions were also generated.   RADIATION DOSE REDUCTION: This exam was performed according to the departmental dose-optimization program which includes automated exposure control, adjustment of the mA and/or kV according to patient size and/or use of iterative reconstruction technique.   COMPARISON:  Left knee series earlier today.   FINDINGS: Bones/Joint/Cartilage   There is normal bone mineralization. There is a slightly depressed roughly ovoid shaped acute fracture fragment of the  central to lateral aspect of the lateral tibial plateau, depressed no more than 2 mm from the plane of the articular surface.   The fracture fragment measures 2.1 cm coronal, 2 cm AP, and 1 cm in height and there is no fracture line exiting the metaphyseal cortex extending from the bed of this fracture fragment.   There are no further fractures. There is a moderate lipohemarthrosis which is predominantly suprapatellar in location. There is no popliteal cyst. Arthritic changes are not seen.   Ligaments   Suboptimally assessed by CT.   Muscles and Tendons   The extensor mechanism is intact. No muscular hematoma is seen. There is normal muscle bulk.   Soft tissues   There is no soft tissue gas.  No soft tissue mass is seen   No surface hematoma.   IMPRESSION: 1. Slightly depressed fracture fragment of the lateral tibial plateau with 2 mm depression. 2. Moderate lipohemarthrosis.  L knee X-ray 09/21/2022 LEFT KNEE - COMPLETE 4+ VIEW   COMPARISON:  None Available.   FINDINGS: Joint effusion with lipohemarthrosis. There is irregularity in the lateral tibial plateau concerning for fracture. Joint spaces maintained. No subluxation or dislocation.   IMPRESSION: Concern for nondisplaced left lateral tibial plateau fracture with lipohemarthrosis. This could be further evaluated with CT.    PATIENT SURVEYS:  LEFS 34/80 = 42.5%  COGNITION: Overall cognitive status: Within functional limits for tasks assessed     SENSATION: Not tested  EDEMA: none observed   MUSCLE LENGTH: Mild restriction on B hamstrings and gastrocsoleus Moderate restriction on B quads Moderate restriction on B piriformis  POSTURE:  supinated feet in standing  PALPATION: No tenderness on major bony landmarks of the B knees  LOWER EXTREMITY ROM:  Active ROM Right eval Left eval  Hip flexion [facility]  Hip extension [facility]  Hip abduction [facility]  Hip adduction    Hip internal rotation     Hip external rotation    Knee flexion 140 140*  Knee extension 0 0  Ankle dorsiflexion [facility] WFL  Ankle plantarflexion [facility] WFL  Ankle inversion    Ankle eversion     (Blank rows = not tested) *knee flexion only at 110 degrees if patient is in prone  LOWER EXTREMITY MMT:  MMT Right eval Left eval  Hip flexion 4 4  Hip extension 4 4  Hip abduction 5 5  Hip adduction 4 4  Hip internal rotation    Hip external rotation    Knee flexion 5 4-  Knee extension 5 4-  Ankle dorsiflexion 5 5  Ankle plantarflexion 5 5  Ankle inversion    Ankle eversion     (Blank rows = not tested)  FUNCTIONAL TESTS: (done on hinged brace) 5 times sit to stand: 7.45 sec 2 minute walk test: 446 ft SLS, eyes open, firm surface: R = 16.67 sec, L = 2.38 sec  GAIT: Distance walked: 446 ft Assistive device utilized: None Level of assistance: Complete Independence Comments: little to no antalgic gait seen, done  with hinged brace reported of L foot pain on the lateral border   TODAY'S TREATMENT:                                                                                                                              DATE:  11/26/2022 Seated hamstring stretch x 30" x 3 Prone quads stretch with a strap x 30" x 3 SLR x 10 x 2 Sidelying hip abd x 10 x 2 Bridging x 3" x 10 x 2 Seated hip adductor squeeze x 10 x 2 x 6" Standing:  L Runner's stretch x 30" x 3  Mini squats x 3" x 10 x 2  Forward step up, 6" box, L LE leading x 10 x 2 Recumbent bike, seat 12 x 5'  11/21/2022  Evaluation and patient education done   PATIENT EDUCATION:  Education details: Updated HEP Person educated: Patient Education method: Explanation Education comprehension: verbalized understanding  HOME EXERCISE PROGRAM: Access Code: [email or web address] URL: [email or web address] Date: 11/26/2022 Prepared by: Rexene Alberts  Exercises - Seated Hamstring Stretch  - 1-2 x daily - 5-7 x weekly - 3 reps - 30 hold -  Prone Quadriceps Stretch with Strap  - 1-2 x daily - 5-7 x weekly - 3 reps - 30 hold - Supine Active Straight Leg Raise  - 1-2 x daily - 5-7 x weekly - 2 sets - 10 reps - Supine Bridge  - 1-2 x daily - 5-7 x weekly - 2 sets - 10 reps - 3 hold - Sidelying Hip Abduction  - 1-2 x daily - 5-7 x weekly - 2 sets - 10 reps - Seated Hip Adduction Isometrics with Ball  - 1-2 x daily - 5-7 x weekly - 2 sets - 10 reps - 6 hold - Gastroc Stretch on Wall  - 1-2 x daily - 5-7 x weekly - 3 reps - 30 hold - Mini Squat  - 1-2 x daily - 5-7 x weekly - 2 sets - 10 reps - 6 hold  ASSESSMENT:  CLINICAL IMPRESSION: Interventions today were geared towards LE strengthening and flexibility. Standing exercises done with the knee brace. Tolerated all activities without worsening of symptoms. However, patient reports of some discomfort on the bike at the post knee especially during ext. Demonstrated appropriate levels of fatigue. Provided slight amount of cueing to ensure correct execution of activity with good carry-over. To date, skilled PT is required to address the impairments and improve function.  EVAL: Patient is a 27 y.o. female who was seen today for physical therapy evaluation and treatment for L tibial plateau fx. Patient was diagnosed with L tibial plateau fx by referring provider further defined by difficulty with walking due to pain, weakness, and decreased soft tissue extensibility. Skilled PT is required to address the impairments and functional limitations listed below.  OBJECTIVE IMPAIRMENTS: decreased activity tolerance, difficulty walking, decreased strength, impaired flexibility, and pain.   ACTIVITY LIMITATIONS: standing, squatting, and stairs  PARTICIPATION LIMITATIONS: cleaning, laundry, community activity, and occupation  PERSONAL FACTORS: Time since onset of injury/illness/exacerbation are also affecting patient's functional outcome.   REHAB POTENTIAL: Good  CLINICAL DECISION MAKING:  Stable/uncomplicated  EVALUATION COMPLEXITY: Low   GOALS: Goals reviewed with patient? Yes  SHORT TERM GOALS: Target date: 12/05/2022 Pt will demonstrate indep in HEP to facilitate carry-over of skilled services and improve functional outcomes Goal status: INITIAL   LONG TERM GOALS: Target date: 12/19/2022  Pt will increase LEFS by at least 27 points in order to demonstrate significant improvement in lower extremity function.  Baseline: 34/80 Goal status: INITIAL  2.  Pt will increase 2MWT by at least 80 ft in order to demonstrate clinically significant improvement in community ambulation Baseline: 446 ft Goal status: INITIAL  3.  Pt will demonstrate increase in LE strength to 5 to facilitate ease and safety in ambulation Baseline: 4- Goal status: INITIAL  4.  Pt will demonstrate increase in L SLS on firm surface with eyes open to 17 sec to facilitate safety in ambulation Baseline: 2.38 sec Goal status: INITIAL  PLAN:  PT FREQUENCY: 2x/week  PT DURATION: 4 weeks  PLANNED INTERVENTIONS: Therapeutic exercises, Therapeutic activity, Neuromuscular re-education, Balance training, Gait training, Patient/Family education, Self Care, and Manual therapy  PLAN FOR NEXT SESSION: Continue POC and may progress as tolerated with emphasis on LE flexibility, strengthening, balance, and gait training.   Harvie Heck. Beretta Ginsberg, PT, DPT, OCS Board-Certified Clinical Specialist in [place] # ([place]): [phone] T 11/26/2022, 2:26 PM

## 2022-12-10 ENCOUNTER — Encounter (HOSPITAL_COMMUNITY): Payer: Medicaid Other

## 2022-12-10 ENCOUNTER — Telehealth (HOSPITAL_COMMUNITY): Payer: Self-pay

## 2022-12-10 NOTE — Telephone Encounter (Signed)
Called patient today to follow up with her appointment. Patient reported that she called earlier to report that her car broke down and is at the shop. No voice message was seen on the phone. Yolanda called patient to reschedule but patient decided to just keep her future appointments and just add more appointments if she needs more.  Harvie Heck. Aarti Mankowski, PT, DPT, OCS Board-Certified Clinical Specialist in [place] # ([place]): [phone] T

## 2022-12-17 ENCOUNTER — Ambulatory Visit (HOSPITAL_COMMUNITY): Payer: Medicaid Other | Attending: Orthopedic Surgery

## 2022-12-17 DIAGNOSIS — R262 Difficulty in walking, not elsewhere classified: Secondary | ICD-10-CM | POA: Diagnosis present

## 2022-12-17 DIAGNOSIS — M25662 Stiffness of left knee, not elsewhere classified: Secondary | ICD-10-CM | POA: Diagnosis present

## 2022-12-17 DIAGNOSIS — G8929 Other chronic pain: Secondary | ICD-10-CM

## 2022-12-17 DIAGNOSIS — M25562 Pain in left knee: Secondary | ICD-10-CM | POA: Diagnosis present

## 2022-12-17 NOTE — Therapy (Signed)
OUTPATIENT PHYSICAL THERAPY LOWER EXTREMITY TREATMENT   Patient Name: SWEET WESTHAFER MRN: [id number] DOB:13-Feb-1996, 27 y.o., female Today's Date: 12/17/2022  END OF SESSION:  PT End of Session - 12/17/22 1129     Visit Number 3    Number of Visits 8    Date for PT Re-Evaluation 12/19/22    Authorization Type [place] Medicaid [employer]    Authorization Time Period 11/21/22-01/20/23    Authorization - Visit Number 2    Authorization - Number of Visits 8    PT Start Time 1125    PT Stop Time 1155    PT Time Calculation (min) 30 min    Activity Tolerance Patient tolerated treatment well    Behavior During Therapy WFL for tasks assessed/performed            Past Medical History:  Diagnosis Date   Hip bursitis    No past surgical history on file. There are no problems to display for this patient.  PCP: Samson Frederic, MD  REFERRING PROVIDER: Samson Frederic, MD  REFERRING DIAG: fracture of tibial plateau  THERAPY DIAG:  Chronic pain of left knee  Stiffness of knee joint, left  Difficulty in walking, not elsewhere classified  Rationale for Evaluation and Treatment: Rehabilitation  ONSET DATE: September 21, 2022  SUBJECTIVE:   SUBJECTIVE STATEMENT: Patient is around 10 minutes late today. Patient is out of the knee brace. Patient went to see her ortho this Monday and claims that her knee is healing good and was allowed to be out of the knee brace. Patient denies any knee pain today. Patient's ortho told her that the foot pain is from tendinitis.  EVAL: Arrives to the clinic with c/o pain on the L foot (outer side),  medial aspect of L knee and low back. Also c/o difficulty with straightening the knee. Patient slipped and fell on a wet grass on September 21, 2022 which broke her L knee. Patient went to the ER. X-ray and CT were done which revealed fracture of the L tibial plateau. Patient was in a knee immobilizer for 1.5 weeks and was initially NWB for 6 weeks. Patient was then  transitioned to a hinged knee brace. When she started to bear weight 2 weeks ago, she was allowed to have 50% weight bearing with crutches. The following week, patient was allowed to be in FWB with B crutches then transitioned to one crutch. Per patient, recent X-ray revealed good healing and patient was then referred to outpatient PT evaluation and management. Patient reports that she was still advised by her physician to use a hinged-brace till December 15, 2022 when standing/walking  PERTINENT HISTORY: L hip bursitis PAIN:  Are you having pain? Yes: NPRS scale: 6/10 Pain location: L foot and medial L knee and low back Pain description: aching Aggravating factors: walking Relieving factors: Ibuprofen  PRECAUTIONS: Other: needs to use the hinged knee brace on ambulation/standing till 12/15/2022  WEIGHT BEARING RESTRICTIONS: No  FALLS:  Has patient fallen in last 6 months? Yes. Number of falls 1  LIVING ENVIRONMENT: Lives with: lives with their partner and lives with their daughter Lives in: House/apartment Stairs: No Has following equipment at home: Crutches and hinged knee brace  OCCUPATION: aesthetician  PLOF: Independent  PATIENT GOALS: "strengthen the knee, retrain the brain how to walk with B legs, no more pain"  NEXT MD VISIT: December 15, 2022  OBJECTIVE:   DIAGNOSTIC FINDINGS:  Narrative & Impression  CT OF THE LEFT KNEE WITHOUT CONTRAST  09/21/2022   TECHNIQUE: Multidetector CT imaging of the left knee was performed according to the standard protocol. Multiplanar CT image reconstructions were also generated.   RADIATION DOSE REDUCTION: This exam was performed according to the departmental dose-optimization program which includes automated exposure control, adjustment of the mA and/or kV according to patient size and/or use of iterative reconstruction technique.   COMPARISON:  Left knee series earlier today.   FINDINGS: Bones/Joint/Cartilage   There is normal bone  mineralization. There is a slightly depressed roughly ovoid shaped acute fracture fragment of the central to lateral aspect of the lateral tibial plateau, depressed no more than 2 mm from the plane of the articular surface.   The fracture fragment measures 2.1 cm coronal, 2 cm AP, and 1 cm in height and there is no fracture line exiting the metaphyseal cortex extending from the bed of this fracture fragment.   There are no further fractures. There is a moderate lipohemarthrosis which is predominantly suprapatellar in location. There is no popliteal cyst. Arthritic changes are not seen.   Ligaments   Suboptimally assessed by CT.   Muscles and Tendons   The extensor mechanism is intact. No muscular hematoma is seen. There is normal muscle bulk.   Soft tissues   There is no soft tissue gas.  No soft tissue mass is seen   No surface hematoma.   IMPRESSION: 1. Slightly depressed fracture fragment of the lateral tibial plateau with 2 mm depression. 2. Moderate lipohemarthrosis.  L knee X-ray 09/21/2022 LEFT KNEE - COMPLETE 4+ VIEW   COMPARISON:  None Available.   FINDINGS: Joint effusion with lipohemarthrosis. There is irregularity in the lateral tibial plateau concerning for fracture. Joint spaces maintained. No subluxation or dislocation.   IMPRESSION: Concern for nondisplaced left lateral tibial plateau fracture with lipohemarthrosis. This could be further evaluated with CT.    PATIENT SURVEYS:  LEFS 34/80 = 42.5%  COGNITION: Overall cognitive status: Within functional limits for tasks assessed     SENSATION: Not tested  EDEMA: none observed   MUSCLE LENGTH: Mild restriction on B hamstrings and gastrocsoleus Moderate restriction on B quads Moderate restriction on B piriformis  POSTURE:  supinated feet in standing  PALPATION: No tenderness on major bony landmarks of the B knees  LOWER EXTREMITY ROM:  Active ROM Right eval Left eval  Hip flexion  [facility]  Hip extension [facility]  Hip abduction [facility]  Hip adduction    Hip internal rotation    Hip external rotation    Knee flexion 140 140*  Knee extension 0 0  Ankle dorsiflexion [facility]  Ankle plantarflexion [facility] WFL  Ankle inversion    Ankle eversion     (Blank rows = not tested) *knee flexion only at 110 degrees if patient is in prone  LOWER EXTREMITY MMT:  MMT Right eval Left eval  Hip flexion 4 4  Hip extension 4 4  Hip abduction 5 5  Hip adduction 4 4  Hip internal rotation    Hip external rotation    Knee flexion 5 4-  Knee extension 5 4-  Ankle dorsiflexion 5 5  Ankle plantarflexion 5 5  Ankle inversion    Ankle eversion     (Blank rows = not tested)  FUNCTIONAL TESTS: (done on hinged brace) 5 times sit to stand: 7.45 sec 2 minute walk test: 446 ft SLS, eyes open, firm surface: R = 16.67 sec, L = 2.38 sec  GAIT: Distance walked: 446 ft Assistive  device utilized: None Level of assistance: Complete Independence Comments: little to no antalgic gait seen, done with hinged brace reported of L foot pain on the lateral border   TODAY'S TREATMENT:                                                                                                                              DATE:  12/17/2022 Recumbent bike, seat 12 x 5' Seated hamstring stretch x 30" x 3 Prone quads stretch with a strap x 30" x 3 SLR x 10 x 2 x 2 lbs  SAQ x 2" x 10 x 2 x 2 lbs Bridging with hip abd, RTB x 3" x 10 x 2 LAQ x 3" x 10 x 2 x 2 lbs Hip vectors, RTB x 10 x 2 on B   11/26/2022 Seated hamstring stretch x 30" x 3 Prone quads stretch with a strap x 30" x 3 SLR x 10 x 2 Sidelying hip abd x 10 x 2 Bridging x 3" x 10 x 2 Seated hip adductor squeeze x 10 x 2 x 6" Standing:  L Runner's stretch x 30" x 3  Mini squats x 3" x 10 x 2  Forward step up, 6" box, L LE leading x 10 x 2 Recumbent bike, seat 12 x 5'  11/21/2022  Evaluation and patient education done   PATIENT EDUCATION:   Education details: Updated HEP Person educated: Patient Education method: Programmer, multimediaxplanation, Facilities managerDemonstration, Verbal cues, and Handouts Education comprehension: verbalized understanding and returned demonstration  HOME EXERCISE PROGRAM: Access Code: [email or web address] URL: [email or web address] 12/17/2022 - Seated Long Arc Quad  - 1-2 x daily - 7 x weekly - 2 sets - 10 reps - 3 hold - Standing 3-Way Leg Reach with Resistance at Ankles and Counter Support  - 1-2 x daily - 7 x weekly - 2 sets - 10 reps  Date: 11/26/2022 Prepared by: Krystal ClarkKenneth Shanedra Lave  Exercises - Seated Hamstring Stretch  - 1-2 x daily - 5-7 x weekly - 3 reps - 30 hold - Prone Quadriceps Stretch with Strap  - 1-2 x daily - 5-7 x weekly - 3 reps - 30 hold - Supine Active Straight Leg Raise  - 1-2 x daily - 5-7 x weekly - 2 sets - 10 reps - Supine Bridge  - 1-2 x daily - 5-7 x weekly - 2 sets - 10 reps - 3 hold - Sidelying Hip Abduction  - 1-2 x daily - 5-7 x weekly - 2 sets - 10 reps - Seated Hip Adduction Isometrics with Ball  - 1-2 x daily - 5-7 x weekly - 2 sets - 10 reps - 6 hold - Gastroc Stretch on Wall  - 1-2 x daily - 5-7 x weekly - 3 reps - 30 hold - Mini Squat  - 1-2 x daily - 5-7 x weekly - 2 sets - 10 reps - 6 hold  ASSESSMENT:  CLINICAL IMPRESSION: Interventions today were geared towards  LE strengthening and flexibility.  Tolerated all activities without worsening of symptoms. Demonstrated appropriate levels of fatigue. Provided very little amount of cueing to ensure correct execution of activity with good carry-over. To date, skilled PT is required to address the impairments and improve function.  EVAL: Patient is a 27 y.o. female who was seen today for physical therapy evaluation and treatment for L tibial plateau fx. Patient was diagnosed with L tibial plateau fx by referring provider further defined by difficulty with walking due to pain, weakness, and decreased soft tissue extensibility. Skilled PT is  required to address the impairments and functional limitations listed below.  OBJECTIVE IMPAIRMENTS: decreased activity tolerance, difficulty walking, decreased strength, impaired flexibility, and pain.   ACTIVITY LIMITATIONS: standing, squatting, and stairs  PARTICIPATION LIMITATIONS: cleaning, laundry, community activity, and occupation  PERSONAL FACTORS: Time since onset of injury/illness/exacerbation are also affecting patient's functional outcome.   REHAB POTENTIAL: Good  CLINICAL DECISION MAKING: Stable/uncomplicated  EVALUATION COMPLEXITY: Low   GOALS: Goals reviewed with patient? Yes  SHORT TERM GOALS: Target date: 12/05/2022 Pt will demonstrate indep in HEP to facilitate carry-over of skilled services and improve functional outcomes Goal status: INITIAL   LONG TERM GOALS: Target date: 12/19/2022  Pt will increase LEFS by at least 27 points in order to demonstrate significant improvement in lower extremity function.  Baseline: 34/80 Goal status: INITIAL  2.  Pt will increase by at least 80 ft in order to demonstrate clinically significant improvement in community ambulation Baseline: 446 ft Goal status: INITIAL  3.  Pt will demonstrate increase in LE strength to 5 to facilitate ease and safety in ambulation Baseline: 4- Goal status: INITIAL  4.  Pt will demonstrate increase in L SLS on firm surface with eyes open to 17 sec to facilitate safety in ambulation Baseline: 2.38 sec Goal status: INITIAL  PLAN:  PT FREQUENCY: 2x/week  PT DURATION: 4 weeks  PLANNED INTERVENTIONS: Therapeutic exercises, Therapeutic activity, Neuromuscular re-education, Balance training, Gait training, Patient/Family education, Self Care, and Manual therapy  PLAN FOR NEXT SESSION: Continue POC and may progress as tolerated with emphasis on LE flexibility, strengthening, balance, and gait training.   Tish Frederickson. Albana Saperstein, PT, DPT, OCS Board-Certified Clinical Specialist in  Orthopedic PT PT [employer] # ([place]): [phone] T 12/17/2022, 11:31 AM

## 2022-12-19 ENCOUNTER — Encounter (HOSPITAL_COMMUNITY): Payer: Medicaid Other

## 2022-12-21 DIAGNOSIS — M2550 Pain in unspecified joint: Secondary | ICD-10-CM | POA: Insufficient documentation

## 2022-12-24 ENCOUNTER — Encounter (HOSPITAL_COMMUNITY): Payer: Medicaid Other | Admitting: Physical Therapy

## 2022-12-26 ENCOUNTER — Encounter (HOSPITAL_COMMUNITY): Payer: Medicaid Other

## 2023-03-02 ENCOUNTER — Emergency Department (HOSPITAL_COMMUNITY): Payer: Self-pay

## 2023-03-02 ENCOUNTER — Emergency Department (HOSPITAL_COMMUNITY)
Admission: EM | Admit: 2023-03-02 | Discharge: 2023-03-03 | Disposition: A | Discharge status: Home / Self Care | Payer: Self-pay | Attending: Student | Admitting: Student

## 2023-03-02 ENCOUNTER — Other Ambulatory Visit: Payer: Self-pay

## 2023-03-02 ENCOUNTER — Encounter (HOSPITAL_COMMUNITY): Payer: Self-pay | Admitting: Emergency Medicine

## 2023-03-02 DIAGNOSIS — R002 Palpitations: Secondary | ICD-10-CM | POA: Insufficient documentation

## 2023-03-02 DIAGNOSIS — R0602 Shortness of breath: Secondary | ICD-10-CM | POA: Insufficient documentation

## 2023-03-02 DIAGNOSIS — R091 Pleurisy: Secondary | ICD-10-CM | POA: Insufficient documentation

## 2023-03-02 LAB — CBC
HCT: 38.2 % (ref 36.0–46.0)
Hemoglobin: 12.4 g/dL (ref 12.0–15.0)
MCH: 29 pg (ref 26.0–34.0)
MCHC: 32.5 g/dL (ref 30.0–36.0)
MCV: 89.3 fL (ref 80.0–100.0)
Platelets: 165 10*3/uL (ref 150–400)
RBC: 4.28 MIL/uL (ref 3.87–5.11)
RDW: 13.2 % (ref 11.5–15.5)
WBC: 9.4 10*3/uL (ref 4.0–10.5)
nRBC: 0 % (ref 0.0–0.2)

## 2023-03-02 LAB — BASIC METABOLIC PANEL
Anion gap: 7 (ref 5–15)
BUN: 8 mg/dL (ref 6–20)
CO2: 26 mmol/L (ref 22–32)
Calcium: 9.4 mg/dL (ref 8.9–10.3)
Chloride: 104 mmol/L (ref 98–111)
Creatinine, Ser: 0.72 mg/dL (ref 0.44–1.00)
GFR, Estimated: >60 mL/min (ref >60)
Glucose, Bld: 96 mg/dL (ref 70–99)
Potassium: 3.7 mmol/L (ref 3.5–5.1)
Sodium: 137 mmol/L (ref 135–145)

## 2023-03-02 LAB — TROPONIN I (HIGH SENSITIVITY): Troponin I (High Sensitivity): 5 ng/L (ref <18)

## 2023-03-02 LAB — HCG, QUANTITATIVE, PREGNANCY: hCG, Beta Chain, Quant, S: <1 m[IU]/mL (ref <5)

## 2023-03-02 MED ORDER — IOHEXOL 350 MG/ML SOLN
100.0000 mL | Freq: Once | INTRAVENOUS | Status: AC | PRN
  Administered 2023-03-02: 100 mL via INTRAVENOUS

## 2023-03-02 MED ORDER — LACTATED RINGERS IV BOLUS
1000.0000 mL | Freq: Once | INTRAVENOUS | Status: AC
  Administered 2023-03-02: 1000 mL via INTRAVENOUS

## 2023-03-02 MED ORDER — KETOROLAC TROMETHAMINE 15 MG/ML IJ SOLN
15.0000 mg | Freq: Once | INTRAMUSCULAR | Status: AC
  Administered 2023-03-02: 15 mg via INTRAVENOUS
  Filled 2023-03-02: qty 1

## 2023-03-02 NOTE — ED Provider Notes (Signed)
[place] EMERGENCY DEPARTMENT AT [facility] Provider Note   CSN: [id number] Arrival date & time: 03/02/23  2124     History  Chief Complaint  Patient presents with   Chest Pain    Marilyn Howard is a 27 y.o. female.  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  Denies any recent long travel, recent surgery.  Not on birth control.  Denies any leg pain or leg swelling.  No history of CAD or other cardiac disease.  States pain is sharp and worse with exertion.  No pain with palpation of her chest wall.  The history is provided by the patient. No language interpreter was used.       Home Medications Prior to Admission medications   Medication Sig Start Date End Date Taking? Authorizing Provider  oxyCODONE-acetaminophen (PERCOCET) 5-325 MG tablet Take 1 tablet by mouth every 4 (four) hours as needed for severe pain. Patient not taking: Reported on 11/21/2022 09/21/22   Gilda Crease, MD  oxyCODONE-acetaminophen (PERCOCET/ROXICET) 5-325 MG tablet Take 1 tablet by mouth every 6 (six) hours as needed for severe pain. Patient not taking: Reported on 11/21/2022 09/21/22   Gilda Crease, MD      Allergies    Patient has no known allergies.    Review of Systems   Review of Systems  Constitutional:  Negative for fever.  Respiratory:  Positive for shortness of breath. Negative for cough.   Cardiovascular:  Positive for chest pain. Negative for palpitations and leg swelling.  Neurological:  Negative for light-headedness.  All other systems reviewed and are negative.   Physical Exam Updated Vital Signs BP (!) 124/92   Pulse (!) 103   Temp 99 F (37.2 C) (Oral)   Resp 20   Ht 5\' 7"  (1.702 m)   Wt 77.1 kg   LMP 02/22/2023 (Approximate)   SpO2 100%   BMI 26.63 kg/m  Physical Exam Vitals and  nursing note reviewed.  Constitutional:      General: She is not in acute distress.    Appearance: Normal appearance. She is not ill-appearing.  HENT:     Head: Normocephalic and atraumatic.     Nose: Nose normal.  Eyes:     General: No scleral icterus.    Extraocular Movements: Extraocular movements intact.     Conjunctiva/sclera: Conjunctivae normal.  Cardiovascular:     Rate and Rhythm: Regular rhythm. Tachycardia present.     Heart sounds: Normal heart sounds.  Pulmonary:     Effort: Pulmonary effort is normal. No respiratory distress.     Breath sounds: Normal breath sounds. No wheezing or rales.  Abdominal:     General: There is no distension.     Tenderness: There is no abdominal tenderness.  Musculoskeletal:        General: Normal range of motion.     Cervical back: Normal range of motion.     Right lower leg: No edema.     Left lower leg: No edema.  Skin:    General: Skin is warm and dry.  Neurological:     General: No focal deficit present.     Mental Status: She is alert. Mental status is at baseline.     ED Results / Procedures / Treatments   Labs (all labs ordered are listed, but only abnormal results are displayed) Labs  Reviewed  CBC  BASIC METABOLIC PANEL  POC URINE PREG, ED  TROPONIN I (HIGH SENSITIVITY)    EKG None  Radiology No results found.  Procedures Procedures    Medications Ordered in ED Medications - No data to display  ED Course/ Medical Decision Making/ A&P                             Medical Decision Making Amount and/or Complexity of Data Reviewed Labs: ordered. Radiology: ordered.  Risk Prescription drug management.   Medical Decision Making / ED Course   This patient presents to the ED for concern of chest pain, this involves an extensive number of treatment options, and is a complaint that carries with it a high risk of complications and morbidity.  The differential diagnosis includes ACS, PE, pneumonia,  costochondritis, other MSK etiology, GERD, dissection  MDM: 27 year old female presents with pleuritic chest pain.  Has history of remote DVT.  She was mildly tachycardic on arrival.  Without tachypnea.  Unable to [facility] out.  Given her risk factors, and description we will proceed with PE study.  CBC is unremarkable.  BMP, troponin are pending.  EKG without acute ischemic changes.  Patient at the end of my shift is awaiting PE study.  Signed out to oncoming provider.   Lab Tests: -I ordered, reviewed, and interpreted labs.   The pertinent results include:   Labs Reviewed  CBC  BASIC METABOLIC PANEL  POC URINE PREG, ED  TROPONIN I (HIGH SENSITIVITY)      EKG  EKG Interpretation  Date/Time:    Ventricular Rate:    PR Interval:    QRS Duration:   QT Interval:    QTC Calculation:   R Axis:     Text Interpretation:           Imaging Studies ordered: I ordered imaging studies including cxr, and cta chest pe study I independently visualized and interpreted imaging. I agree with the radiologist interpretation   Medicines ordered and prescription drug management: No orders of the defined types were placed in this encounter.   -I have reviewed the patients home medicines and have made adjustments as needed  Cardiac Monitoring: The patient was maintained on a cardiac monitor.  I personally viewed and interpreted the cardiac monitored which showed an underlying rhythm of: sinus tach  Co morbidities that complicate the patient evaluation  Past Medical History:  Diagnosis Date   Hip bursitis       Dispostion: Patient at the end of my shift signed out to oncoming provider pending PE study.  Final Clinical Impression(s) / ED Diagnoses Final diagnoses:  Atypical chest pain    Rx / DC Orders ED Discharge Orders     None         Marita Kansas, PA-C 03/02/23 2308    Zadie Rhine, MD 03/03/23 [phone]

## 2023-03-02 NOTE — ED Notes (Signed)
Patient transported to X-ray 

## 2023-03-02 NOTE — ED Triage Notes (Signed)
Pt c/o chest pain that started last night after eating pizza. Pt states pain continued today when she takes a deep breath.

## 2023-03-05 ENCOUNTER — Ambulatory Visit: Payer: Self-pay | Attending: Internal Medicine | Admitting: Internal Medicine

## 2023-03-05 ENCOUNTER — Encounter: Payer: Self-pay | Admitting: Internal Medicine

## 2023-03-05 VITALS — BP 120/74 | HR 91 | Ht 66.0 in | Wt 168.0 lb

## 2023-03-05 DIAGNOSIS — R002 Palpitations: Secondary | ICD-10-CM | POA: Insufficient documentation

## 2023-03-05 HISTORY — DX: Palpitations: R00.2

## 2023-03-05 MED ORDER — DILTIAZEM HCL 60 MG PO TABS: 60.0000 mg | ORAL_TABLET | Freq: Every day | ORAL | 3 refills | Status: DC | PRN

## 2023-03-05 NOTE — Patient Instructions (Signed)
Medication Instructions:  Your physician has recommended you make the following change in your medication:   -Start Diltiazem once daily as needed for palpitations   *If you need a refill on your cardiac medications before your next appointment, please call your pharmacy*   Lab Work: None If you have labs (blood work) drawn today and your tests are completely normal, you will receive your results only by: MyChart Message (if you have MyChart) OR A paper copy in the mail If you have any lab test that is abnormal or we need to change your treatment, we will call you to review the results.   Testing/Procedures: None   Follow-Up: At [facility], you and your health needs are our priority.  As part of our continuing mission to provide you with exceptional heart care, we have created designated Provider Care Teams.  These Care Teams include your primary Cardiologist (physician) and Advanced Practice Providers (APPs -  Physician Assistants and Nurse Practitioners) who all work together to provide you with the care you need, when you need it.  We recommend signing up for the patient portal called "MyChart".  Sign up information is provided on this After Visit Summary.  MyChart is used to connect with patients for Virtual Visits (Telemedicine).  Patients are able to view lab/test results, encounter notes, upcoming appointments, etc.  Non-urgent messages can be sent to your provider as well.   To learn more about what you can do with MyChart, go to [email or web address].    Your next appointment:    Follow up as needed.   Provider:   Luane School, MD    Other Instructions Please call our office if you start having more frequent palpitations. We will mail you a Zio cardiac monitor at that time.

## 2023-03-05 NOTE — Progress Notes (Signed)
Cardiology Office Note  Date: 03/05/2023   ID: Marilyn Howard, DOB [birth year], MRN [id number]  PCP:  [facility]  Cardiologist:  None Electrophysiologist:  None   Reason for Office Visit: Palpitations   History of Present Illness: Marilyn Howard is a 26 y.o. female with no PMH was referred to cardiology clinic for evaluation of palpitations.  Patient had 4 episodes of palpitations in the last 3 years. These palpitations last for 1 to 2 seconds and occurs around 30-60 times per day. These occur in the setting of anxiety/depression, when she gets scared/excited or in the setting of anemia/heavy menstrual flow. Last episode of palpitations was couple of months ago.  She has chest pains with deep inspiration was diagnosed with pleurisy on 03/02/2023 ER visit.  Currently taking pain medications.  Takes caffeine/coffee 1 cup/day and drinks alcohol socially.  Past Medical History:  Diagnosis Date   Hip bursitis     Past Surgical History:  Procedure Laterality Date   CESAREAN SECTION     TONSILLECTOMY      Current Outpatient Medications  Medication Sig Dispense Refill   ibuprofen (ADVIL) 800 MG tablet Take 800 mg by mouth daily as needed.     No current facility-administered medications for this visit.   Allergies:  Patient has no known allergies.   Social History: The patient  reports that she has never smoked. She has never used smokeless tobacco. She reports that she does not currently use alcohol. She reports that she does not currently use drugs.   Family History: The patient's family history is not on file.   ROS:  Please see the history of present illness. Otherwise, complete review of systems is positive for none.  All other systems are reviewed and negative.   Physical Exam: VS:  Ht 5\' 6"  (1.676 m)   Wt 168 lb (76.2 kg)   LMP 02/22/2023 (Approximate)   BMI 27.12 kg/m , BMI Body mass index is 27.12 kg/m.  Wt Readings from Last 3  Encounters:  03/05/23 168 lb (76.2 kg)  03/02/23 170 lb (77.1 kg)  09/21/22 178 lb 9.2 oz (81 kg)    General: Patient appears comfortable at rest. HEENT: Conjunctiva and lids normal, oropharynx clear with moist mucosa. Neck: Supple, no elevated JVP or carotid bruits, no thyromegaly. Lungs: Clear to auscultation, nonlabored breathing at rest. Cardiac: Regular rate and rhythm, no S3 or significant systolic murmur, no pericardial rub. Abdomen: Soft, nontender, no hepatomegaly, bowel sounds present, no guarding or rebound. Extremities: No pitting edema, distal pulses 2+. Skin: Warm and dry. Musculoskeletal: No kyphosis. Neuropsychiatric: Alert and oriented x3, affect grossly appropriate.  Recent Labwork: 03/02/2023: BUN 8; Creatinine, Ser 0.72; Hemoglobin 12.4; Platelets 165; Potassium 3.7; Sodium 137  No results found for: "CHOL", "TRIG", "HDL", "CHOLHDL", "VLDL", "LDLCALC", "LDLDIRECT"  Other Studies Reviewed Today:   Assessment and Plan:  # Palpitations -Patient had 4 episodes of palpitations in the last 3 years. These palpitations last for 1 to 2 seconds and occurs around 30-60 times per day. Start diltiazem 60 mg every 8 hours as needed for palpitations. Patient is instructed to call the clinic if she has frequent palpitations in the future and we will mail her a 1 week event monitor. Continue healthy diet and exercise.  I have spent a total of 30 minutes with patient reviewing chart, EKGs, labs and examining patient as well as establishing an assessment and plan that was discussed with the patient.  > 50%  of time was spent in direct patient care.    Medication Adjustments/Labs and Tests Ordered: Current medicines are reviewed at length with the patient today.  Concerns regarding medicines are outlined above.   Tests Ordered: No orders of the defined types were placed in this encounter.   Medication Changes: No orders of the defined types were placed in this  encounter.   Disposition:  Follow up prn  Signed, Adisyn Ruscitti Verne Spurr, MD, 03/05/2023 11:09 AM    [facility] at [facility] [address] [place], [place] [address]

## 2023-04-02 ENCOUNTER — Telehealth: Payer: Self-pay | Admitting: Internal Medicine

## 2023-04-02 ENCOUNTER — Ambulatory Visit: Payer: Self-pay | Attending: Internal Medicine

## 2023-04-02 DIAGNOSIS — R002 Palpitations: Secondary | ICD-10-CM

## 2023-04-02 NOTE — Telephone Encounter (Signed)
Called pt and notified that 7 day Zio monitor will be mailed to her home. Pt voiced understanding agrees with plan of care.

## 2023-04-02 NOTE — Telephone Encounter (Signed)
Patient states she was told to call if she felt like she needed to have the heart monitor. She is calling to see if this can be ordered for her. Please advise.

## 2023-04-27 DIAGNOSIS — R002 Palpitations: Secondary | ICD-10-CM

## 2023-05-14 ENCOUNTER — Ambulatory Visit: Payer: Self-pay

## 2023-05-14 VITALS — BP 120/79 | HR 109

## 2023-05-14 DIAGNOSIS — Z3200 Encounter for pregnancy test, result unknown: Secondary | ICD-10-CM

## 2023-05-14 LAB — POCT URINE PREGNANCY: Preg Test, Ur: POSITIVE — AB

## 2023-05-14 NOTE — Progress Notes (Signed)
..  Ms. Marilyn Howard presents today for UPT. She has no unusual complaints. LMP: 03/28/23     OBJECTIVE: Appears well, in no apparent distress.  OB History     Gravida  2   Para  1   Term  1   Preterm      AB      Living  1      SAB      IAB      Ectopic      Multiple      Live Births  1          Home UPT Result:Positive  In-Office UPT result:Positive I have reviewed the patient's medical, obstetrical, social, and family histories, and medications.   ASSESSMENT: Positive pregnancy test  PLAN Prenatal care to be completed at: [facility] Provided safe med list Pt states that she purchased PNV

## 2023-05-22 ENCOUNTER — Other Ambulatory Visit: Payer: Self-pay

## 2023-05-22 ENCOUNTER — Emergency Department (HOSPITAL_COMMUNITY)
Admission: EM | Admit: 2023-05-22 | Discharge: 2023-05-22 | Disposition: A | Discharge status: Home / Self Care | Payer: Medicaid Other | Attending: Emergency Medicine | Admitting: Emergency Medicine

## 2023-05-22 ENCOUNTER — Encounter (HOSPITAL_COMMUNITY): Payer: Self-pay

## 2023-05-22 DIAGNOSIS — N3000 Acute cystitis without hematuria: Secondary | ICD-10-CM | POA: Diagnosis not present

## 2023-05-22 DIAGNOSIS — R350 Frequency of micturition: Secondary | ICD-10-CM | POA: Diagnosis present

## 2023-05-22 LAB — URINALYSIS, ROUTINE W REFLEX MICROSCOPIC
Bilirubin Urine: NEGATIVE
Glucose, UA: NEGATIVE mg/dL
Hgb urine dipstick: NEGATIVE
Ketones, ur: NEGATIVE mg/dL
Nitrite: NEGATIVE
Protein, ur: NEGATIVE mg/dL
Specific Gravity, Urine: 1.019 (ref 1.005–1.030)
pH: 6 (ref 5.0–8.0)

## 2023-05-22 MED ORDER — CEPHALEXIN 500 MG PO CAPS: 500.0000 mg | ORAL_CAPSULE | Freq: Four times a day (QID) | ORAL | 0 refills | Status: AC

## 2023-05-22 NOTE — ED Triage Notes (Signed)
C/o urinary frequency, hesitancy, fullness, lower abd pain, and burning with urination x 2days.  [redacted] weeks pregnant.

## 2023-05-22 NOTE — ED Provider Notes (Signed)
 [place] EMERGENCY DEPARTMENT AT [facility] Provider Note   CSN: [id number] Arrival date & time: 05/22/23  1406     History Chief Complaint  Patient presents with   Urinary Frequency    Marilyn Howard is a 27 y.o. female.  Patient presents to the emergency department concerns of increased urinary frequency, tendency, fullness and lower abdominal tenderness and burning with urination.  States this has been ongoing for 2 days.  Currently [redacted] weeks pregnant.  Denies any concerns for STIs.   Urinary Frequency       Home Medications Prior to Admission medications   Medication Sig Start Date End Date Taking? Authorizing Provider  cephALEXin (KEFLEX) 500 MG capsule Take 1 capsule (500 mg total) by mouth 4 (four) times daily for 7 days. 05/22/23 05/29/23 Yes Smitty Knudsen, PA-C  diltiazem (CARDIZEM) 60 MG tablet Take 1 tablet (60 mg total) by mouth daily as needed (Palpitations). Patient not taking: Reported on 05/14/2023 03/05/23   Mallipeddi, Vishnu P, MD  ibuprofen (ADVIL) 800 MG tablet Take 800 mg by mouth daily as needed. Patient not taking: Reported on 05/14/2023 08/19/22   [provider]  Prenatal Vit-Fe Fumarate-FA (MULTIVITAMIN-PRENATAL) 27-0.8 MG TABS tablet Take 1 tablet by mouth daily at 12 noon. [employer]    [provider]      Allergies    Patient has no known allergies.    Review of Systems   Review of Systems  Genitourinary:  Positive for frequency.  All other systems reviewed and are negative.   Physical Exam Updated Vital Signs BP 115/82 (BP Location: Right Arm)   Pulse 77   Temp 98.8 F (37.1 C) (Oral)   Resp 14   Wt 75.3 kg   LMP 03/28/2023   SpO2 100%   BMI 26.79 kg/m  Physical Exam Vitals and nursing note reviewed.  Constitutional:      General: She is not in acute distress.    Appearance: She is well-developed.  HENT:     Head: Normocephalic and atraumatic.  Eyes:     Conjunctiva/sclera: Conjunctivae normal.   Cardiovascular:     Rate and Rhythm: Normal rate and regular rhythm.     Heart sounds: No murmur heard. Pulmonary:     Effort: Pulmonary effort is normal. No respiratory distress.     Breath sounds: Normal breath sounds.  Abdominal:     Palpations: Abdomen is soft.     Tenderness: There is no abdominal tenderness.  Musculoskeletal:        General: No swelling.     Cervical back: Neck supple.  Skin:    General: Skin is warm and dry.     Capillary Refill: Capillary refill takes less than 2 seconds.  Neurological:     Mental Status: She is alert.  Psychiatric:        Mood and Affect: Mood normal.     ED Results / Procedures / Treatments   Labs (all labs ordered are listed, but only abnormal results are displayed) Labs Reviewed  URINALYSIS, ROUTINE W REFLEX MICROSCOPIC - Abnormal; Notable for the following components:      Result Value   APPearance HAZY (*)    Leukocytes,Ua LARGE (*)    Bacteria, UA RARE (*)    All other components within normal limits    EKG None  Radiology No results found.  Procedures Procedures   Medications Ordered in ED Medications - No data to display  ED Course/ Medical Decision Making/  A&P                               Medical Decision Making Amount and/or Complexity of Data Reviewed Labs: ordered.  Risk Prescription drug management.   This patient presents to the ED for concern of urinary frequency.  Differential diagnosis includes UTI, pyelonephritis, bowel obstruction, cauda equina syndrome   Lab Tests:  I Ordered, and personally interpreted labs.  The pertinent results include: UA with evidence of infection with leukocytes and bacteria present   Problem List / ED Course:  Patient presents emergency department concerns of increased urinary frequency, hesitancy, Famous and burning with urination.  Versus been ongoing for about 2 days or so.  Currently [redacted] weeks pregnant.  Will evaluate with urinalysis given concerning  symptoms.  Physical exam is largely unremarkable without any acute area of focal tenderness.  UA with evidence of infection with leukocytes and bacteria present.  Given current 6-week pregnancy we will treat with Keflex advised patient to follow-up with PCP/OB/GYN for repeat evaluation if symptoms not resolving.  Discussed strict return precautions. All questions answered prior to patient discharge. Patient discharged home in stable condition.   Final Clinical Impression(s) / ED Diagnoses Final diagnoses:  Acute cystitis without hematuria    Rx / DC Orders ED Discharge Orders          Ordered    cephALEXin (KEFLEX) 500 MG capsule  4 times daily        05/22/23 1557              Smitty Knudsen, PA-C 05/22/23 1602    Bethann Berkshire, MD 05/25/23 [phone]

## 2023-05-22 NOTE — Discharge Instructions (Signed)
You were seen in the ER today for urinary concerns. It appears that you have a UTI based on your urinalysis. I have sent a prescription for Keflex to your pharmacy to take for the next week. Please take this as prescribed and return to the ER if symptoms are worsening, but otherwise follow up with your OBGYN.

## 2023-05-22 NOTE — ED Notes (Signed)
Introduced self to pt Pt complains of urinary frequency, burning on urination and pressure to urinate.  Pt stated she is [redacted] weeks pregnant.  Call bell on stretcher  Waiting for provider eval and further orders.

## 2023-05-27 ENCOUNTER — Encounter: Payer: Self-pay | Admitting: Obstetrics and Gynecology

## 2023-05-28 ENCOUNTER — Other Ambulatory Visit: Payer: Self-pay

## 2023-05-28 ENCOUNTER — Emergency Department (HOSPITAL_COMMUNITY)
Admission: EM | Admit: 2023-05-28 | Discharge: 2023-05-29 | Disposition: A | Discharge status: Home / Self Care | Payer: Medicaid Other | Attending: Emergency Medicine | Admitting: Emergency Medicine

## 2023-05-28 ENCOUNTER — Encounter (HOSPITAL_COMMUNITY): Payer: Self-pay | Admitting: Emergency Medicine

## 2023-05-28 DIAGNOSIS — R7309 Other abnormal glucose: Secondary | ICD-10-CM | POA: Insufficient documentation

## 2023-05-28 DIAGNOSIS — Z3A01 Less than 8 weeks gestation of pregnancy: Secondary | ICD-10-CM | POA: Diagnosis not present

## 2023-05-28 DIAGNOSIS — D649 Anemia, unspecified: Secondary | ICD-10-CM | POA: Diagnosis not present

## 2023-05-28 DIAGNOSIS — R102 Pelvic and perineal pain: Secondary | ICD-10-CM | POA: Diagnosis not present

## 2023-05-28 DIAGNOSIS — O26891 Other specified pregnancy related conditions, first trimester: Secondary | ICD-10-CM | POA: Diagnosis present

## 2023-05-28 LAB — URINALYSIS, ROUTINE W REFLEX MICROSCOPIC
Bacteria, UA: NONE SEEN
Bilirubin Urine: NEGATIVE
Glucose, UA: NEGATIVE mg/dL
Hgb urine dipstick: NEGATIVE
Ketones, ur: NEGATIVE mg/dL
Nitrite: NEGATIVE
Protein, ur: NEGATIVE mg/dL
Specific Gravity, Urine: 1.009 (ref 1.005–1.030)
pH: 7 (ref 5.0–8.0)

## 2023-05-28 LAB — LIPASE, BLOOD: Lipase: 27 U/L (ref 11–51)

## 2023-05-28 LAB — COMPREHENSIVE METABOLIC PANEL
ALT: 15 U/L (ref 0–44)
AST: 15 U/L (ref 15–41)
Albumin: 4.2 g/dL (ref 3.5–5.0)
Alkaline Phosphatase: 64 U/L (ref 38–126)
Anion gap: 7 (ref 5–15)
BUN: 5 mg/dL — ABNORMAL LOW (ref 6–20)
CO2: 24 mmol/L (ref 22–32)
Calcium: 9.5 mg/dL (ref 8.9–10.3)
Chloride: 105 mmol/L (ref 98–111)
Creatinine, Ser: 0.61 mg/dL (ref 0.44–1.00)
GFR, Estimated: >60 mL/min (ref >60)
Glucose, Bld: 112 mg/dL — ABNORMAL HIGH (ref 70–99)
Potassium: 3.9 mmol/L (ref 3.5–5.1)
Sodium: 136 mmol/L (ref 135–145)
Total Bilirubin: 0.2 mg/dL — ABNORMAL LOW (ref 0.3–1.2)
Total Protein: 7.6 g/dL (ref 6.5–8.1)

## 2023-05-28 LAB — CBC
HCT: 35 % — ABNORMAL LOW (ref 36.0–46.0)
Hemoglobin: 11.8 g/dL — ABNORMAL LOW (ref 12.0–15.0)
MCH: 29.6 pg (ref 26.0–34.0)
MCHC: 33.7 g/dL (ref 30.0–36.0)
MCV: 87.7 fL (ref 80.0–100.0)
Platelets: 200 10*3/uL (ref 150–400)
RBC: 3.99 MIL/uL (ref 3.87–5.11)
RDW: 13.1 % (ref 11.5–15.5)
WBC: 7.6 10*3/uL (ref 4.0–10.5)
nRBC: 0 % (ref 0.0–0.2)

## 2023-05-28 NOTE — ED Triage Notes (Signed)
Pt [redacted] weeks pregnant currently experiencing severe cramping, no bleeding, currently seen at this ED and diagnosed with UTI and currently taking antibiotics.

## 2023-05-29 ENCOUNTER — Emergency Department (HOSPITAL_COMMUNITY): Payer: Medicaid Other

## 2023-05-29 LAB — WET PREP, GENITAL
Sperm: NONE SEEN
Trich, Wet Prep: NONE SEEN
WBC, Wet Prep HPF POC: >=10 — AB (ref <10)
Yeast Wet Prep HPF POC: NONE SEEN

## 2023-05-29 LAB — RPR: RPR Ser Ql: NONREACTIVE

## 2023-05-29 LAB — HIV ANTIBODY (ROUTINE TESTING W REFLEX): HIV Screen 4th Generation wRfx: NONREACTIVE

## 2023-05-29 LAB — HCG, QUANTITATIVE, PREGNANCY: hCG, Beta Chain, Quant, S: 57626 m[IU]/mL — ABNORMAL HIGH (ref <5)

## 2023-05-29 NOTE — ED Notes (Signed)
Pt ambulated to restroom. 

## 2023-05-29 NOTE — ED Notes (Signed)
Patient transported to Ultrasound

## 2023-05-29 NOTE — ED Provider Notes (Signed)
[place] EMERGENCY DEPARTMENT AT [facility] Provider Note   CSN: [id number] Arrival date & time: 05/28/23  2118     History  Chief Complaint  Patient presents with   Abdominal Cramping    Marilyn Howard is a 27 y.o. female.  The history is provided by the patient.  Abdominal Cramping  She is approximately [redacted] weeks pregnant and comes in complaining of urinary symptoms and lower abdominal pain.  She was seen in the emergency department 1 week ago and diagnosed with urinary tract infection and given prescription for cephalexin.  At that time, she had dysuria as well as a sense of incompletely emptying her bladder.  Her dysuria has improved but she still feels like she is not emptying her bladder and she still has urinary urgency and frequency.  Also, she has had several episodes of intense suprapubic pain which only last a few seconds but are much more severe than even her normal menstrual cramps.  She has not had any vaginal bleeding or spotting.  She has had a vaginal discharge without associated odor.  She is gravida 2, para 1.   Home Medications Prior to Admission medications   Medication Sig Start Date End Date Taking? Authorizing Provider  cephALEXin (KEFLEX) 500 MG capsule Take 1 capsule (500 mg total) by mouth 4 (four) times daily for 7 days. 05/22/23 05/29/23  Smitty Knudsen, PA-C  diltiazem (CARDIZEM) 60 MG tablet Take 1 tablet (60 mg total) by mouth daily as needed (Palpitations). Patient not taking: Reported on 05/14/2023 03/05/23   Mallipeddi, Vishnu P, MD  ibuprofen (ADVIL) 800 MG tablet Take 800 mg by mouth daily as needed. Patient not taking: Reported on 05/14/2023 08/19/22   [provider]  Prenatal Vit-Fe Fumarate-FA (MULTIVITAMIN-PRENATAL) 27-0.8 MG TABS tablet Take 1 tablet by mouth daily at 12 noon. Olly Gummies    [provider]      Allergies    Patient has no known allergies.    Review of Systems   Review of Systems  All other  systems reviewed and are negative.   Physical Exam Updated Vital Signs BP (!) 102/55   Pulse 88   Temp 99.2 F (37.3 C) (Oral)   Resp 18   Ht 5\' 6"  (1.676 m)   Wt 74.8 kg   LMP 03/28/2023   SpO2 100%   BMI 26.63 kg/m  Physical Exam Vitals and nursing note reviewed.   27 year old female, resting comfortably and in no acute distress. Vital signs are significant for elevated blood pressure. Oxygen saturation is 100%, which is normal. Head is normocephalic and atraumatic. PERRLA, EOMI.  Back is nontender and there is no CVA tenderness. Lungs are clear without rales, wheezes, or rhonchi. Chest is nontender. Heart has regular rate and rhythm without murmur. Abdomen is soft, flat, with mild to moderate tenderness across the suprapubic area.  Fundus is nearly to the umbilicus. Pelvic: Normal external female genitalia.  Cervix is closed.  No vaginal bleeding noted.  Mild to moderate vaginal discharge present.  Fundus is approximately 16-18-week size.  There is adnexal fullness on the left without any discrete tenderness.  No adnexal fullness or mass on the right. Extremities have no cyanosis or edema, full range of motion is present. Skin is warm and dry without rash. Neurologic: Mental status is normal, cranial nerves are intact, moves all extremities equally.  ED Results / Procedures / Treatments   Labs (all labs ordered are listed, but only abnormal  results are displayed) Labs Reviewed  COMPREHENSIVE METABOLIC PANEL - Abnormal; Notable for the following components:      Result Value   Glucose, Bld 112 (*)    BUN 5 (*)    Total Bilirubin 0.2 (*)    All other components within normal limits  CBC - Abnormal; Notable for the following components:   Hemoglobin 11.8 (*)    HCT 35.0 (*)    All other components within normal limits  URINALYSIS, ROUTINE W REFLEX MICROSCOPIC - Abnormal; Notable for the following components:   Color, Urine STRAW (*)    Leukocytes,Ua TRACE (*)    All  other components within normal limits  LIPASE, BLOOD    EKG None  Radiology No results found.  Procedures Procedures    Medications Ordered in ED Medications - No data to display  ED Course/ Medical Decision Making/ A&P                                 Medical Decision Making Amount and/or Complexity of Data Reviewed Labs: ordered. Radiology: ordered.   Pelvic pain and what appears to be second trimester pregnancy.  Uterine size is significantly different from what would be expected from patient's last menses.  Fetal heart rate was checked and is 175 consistent with patient being much further along than expected.  Because of her pain is unclear.  Consider possible placental abruption, intermittent ovarian torsion.  I have reviewed her laboratory tests, and my interpretation is mildly elevated random glucose which is not unexpected in pregnancy, mild anemia which is also not unexpected in pregnancy, urinalysis showing no evidence of UTI.  I have ordered quantitative hCG as well as STI panel.  I have reviewed her past records, and she is blood type O+, not a RhoGAM candidate.  I have reviewed her wet prep and my impression is clue cells present consistent with bacterial vaginosis.  hCG level was 57,626, will be useful for comparison if she returns with any complications.  Ultrasound shows single viable intrauterine pregnancy with gestational age of [redacted] weeks 5 days and fetal heart rate of 130 bpm.  I have independently viewed the images, and agree with radiologist's interpretation.  I cannot explain why fundus seems much larger on my exam.  No cause for pelvic pain was identified.  I have informed the patient of these findings.  I recommended that she follow-up with her obstetrician, take acetaminophen as needed for pain.  She is asking for help with her sense of urinary urgency which I suspect is bladder spasm.  However, I have advised her that urinary antispasmodics are generally not  recommended for first trimester pregnancy.  Return precautions have been discussed.  Final Clinical Impression(s) / ED Diagnoses Final diagnoses:  Pelvic pain affecting pregnancy in first trimester, antepartum  Elevated random blood glucose level  Normochromic normocytic anemia    Rx / DC Orders ED Discharge Orders     None         Dione Booze, MD 05/29/23 [phone]

## 2023-05-29 NOTE — ED Notes (Signed)
Pt ambulated to the restroom.

## 2023-05-29 NOTE — Discharge Instructions (Addendum)
Your ultrasound did not show any problems with the pregnancy.  By the ultrasound, you are 6 weeks, 5 days pregnant.  You may take acetaminophen as needed for pain.  Do not take ibuprofen or naproxen.  Drink plenty of fluids.  If you develop bleeding or if pain is getting worse, return to the emergency department.

## 2023-06-01 LAB — GC/CHLAMYDIA PROBE AMP (~~LOC~~) NOT AT ARMC
Chlamydia: NEGATIVE
Comment: NEGATIVE
Comment: NORMAL
Neisseria Gonorrhea: NEGATIVE

## 2023-06-11 ENCOUNTER — Other Ambulatory Visit (INDEPENDENT_AMBULATORY_CARE_PROVIDER_SITE_OTHER): Payer: Medicaid Other

## 2023-06-11 ENCOUNTER — Ambulatory Visit: Payer: Medicaid Other | Admitting: *Deleted

## 2023-06-11 VITALS — BP 133/87 | HR 96 | Wt 167.6 lb

## 2023-06-11 DIAGNOSIS — Z362 Encounter for other antenatal screening follow-up: Secondary | ICD-10-CM

## 2023-06-11 DIAGNOSIS — O0991 Supervision of high risk pregnancy, unspecified, first trimester: Secondary | ICD-10-CM

## 2023-06-11 DIAGNOSIS — Z3A08 8 weeks gestation of pregnancy: Secondary | ICD-10-CM

## 2023-06-11 DIAGNOSIS — O0990 Supervision of high risk pregnancy, unspecified, unspecified trimester: Secondary | ICD-10-CM | POA: Insufficient documentation

## 2023-06-11 DIAGNOSIS — Z1339 Encounter for screening examination for other mental health and behavioral disorders: Secondary | ICD-10-CM | POA: Diagnosis not present

## 2023-06-11 MED ORDER — BLOOD PRESSURE KIT DEVI
1.0000 | 0 refills | Status: DC
Start: 2023-06-11 — End: 2024-01-26

## 2023-06-11 NOTE — Patient Instructions (Signed)

## 2023-06-11 NOTE — Progress Notes (Signed)
New OB Intake  I connected with Marilyn Howard  on 06/11/23 at 10:15 AM EDT by In Person Visit and verified that I am speaking with the correct person using two identifiers. Nurse is located at [facility] and pt is located at [place].  I discussed the limitations, risks, security and privacy concerns of performing an evaluation and management service by telephone and the availability of in person appointments. I also discussed with the patient that there may be a patient responsible charge related to this service. The patient expressed understanding and agreed to proceed.  I explained I am completing New OB Intake today. We discussed EDD of 01/16/2024, by Ultrasound. Pt is [id number]. I reviewed her allergies, medications and Medical/Surgical/OB history.    Patient Active Problem List   Diagnosis Date Noted   Palpitations 03/05/2023    Concerns addressed today  Delivery Plans Plans to deliver at [facility]. Discussed the nature of our practice with multiple providers including residents and students. Due to the size of the practice, the delivering provider may not be the same as those providing prenatal care.   Patient is not a candidate for water birth. Offered upcoming OB visit with CNM to discuss further.  MyChart/Babyscripts MyChart access verified. I explained pt will have some visits in office and some virtually. Babyscripts instructions given and order placed. Patient verifies receipt of registration text/e-mail. Account successfully created and app downloaded.  Blood Pressure Cuff/Weight Scale Blood pressure cuff ordered for patient to pick-up from [employer]. Explained after first prenatal appt pt will check weekly and document in Babyscripts. Patient does not have weight scale; patient may purchase if they desire to track weight weekly in Babyscripts.  Anatomy US Explained first scheduled [place] will be around 19 weeks. Anatomy US scheduled for TBD at TBD.  Interested in [place]? If  yes, send referral and doula dot phrase.   Is patient a candidate for Babyscripts Optimization? No - high risk  First visit review I reviewed new OB appt with patient. Explained pt will be seen by Sharen Counter, CNM at first visit. Discussed Avelina Laine genetic screening with patient. Requests Panorama and Horizon.. Routine prenatal labs  OB Urine collected at today's visit.    Last Pap No results found for: "DIAGPAP"  Harrel Lemon, RN 06/11/2023  10:48 AM

## 2023-06-25 ENCOUNTER — Telehealth: Payer: Medicaid Other | Admitting: Internal Medicine

## 2023-06-25 ENCOUNTER — Ambulatory Visit: Payer: Medicaid Other | Attending: Internal Medicine | Admitting: Internal Medicine

## 2023-06-25 ENCOUNTER — Telehealth: Payer: Self-pay

## 2023-06-25 ENCOUNTER — Encounter: Payer: Self-pay | Admitting: Internal Medicine

## 2023-06-25 DIAGNOSIS — R002 Palpitations: Secondary | ICD-10-CM | POA: Diagnosis not present

## 2023-06-25 NOTE — Progress Notes (Signed)
Virtual Visit via Telephone Note   Because of Marilyn Howard's co-morbid illnesses, she is at least at moderate risk for complications without adequate follow up.  This format is felt to be most appropriate for this patient at this time.  The patient did not have access to video technology/had technical difficulties with video requiring transitioning to audio format only (telephone).  All issues noted in this document were discussed and addressed.  No physical exam could be performed with this format.  Please refer to the patient's chart for her consent to telehealth for [facility].    Date:  06/25/2023   ID:  Marilyn Howard, DOB 1996/05/06, MRN [id number] The patient was identified using 2 identifiers.  Patient Location: Other:  work Dispensing optician: Office/Clinic   PCP:  IT consultant, [facility]   [facility] Providers Cardiologist:  Marjo Bicker, MD   Evaluation Performed:  Follow-Up Visit  Chief Complaint:  Discuss event monitor findings  History of Present Illness:    Marilyn Howard is a 27 y.o. female with no PMH is here to discuss event monitor results.  She conceived recently and she noticed her palpitations decreased in frequency.  Currently occurring 6 times in a week compared to 20-30 times on a given single day previously.  No other symptoms.  Overall doing great.  She was told by her PCP to follow-up with a cardiologist who specialized in obstetrics.  I discussed her event monitor results in detail.   Past Medical History:  Diagnosis Date   Anemia    DVT (deep venous thrombosis) (HCC) 2016   Heart murmur    Hip bursitis    Past Surgical History:  Procedure Laterality Date   CESAREAN SECTION     TONSILLECTOMY     and adenoids     Current Meds  Medication Sig   acetaminophen (TYLENOL) 500 MG tablet Take 500 mg by mouth every 6 (six) hours as needed for moderate pain.   Blood Pressure Monitoring (BLOOD  PRESSURE KIT) DEVI 1 Device by Does not apply route once a week.   cyclobenzaprine (FLEXERIL) 10 MG tablet Take 10 mg by mouth 3 (three) times daily as needed.   Prenatal Vit-Fe Fumarate-FA (MULTIVITAMIN-PRENATAL) 27-0.8 MG TABS tablet Take 1 tablet by mouth daily at 12 noon. Olly Gummies     Allergies:   Patient has no known allergies.   Social History   Tobacco Use   Smoking status: Former    Current packs/day: 0.00    Types: Cigarettes    Quit date: 2016    Years since quitting: 8.8   Smokeless tobacco: Never  Vaping Use   Vaping status: Never Used  Substance Use Topics   Alcohol use: Not Currently   Drug use: Not Currently     Family Hx: The patient's family history includes Graves' disease in her mother.  ROS:   Please see the history of present illness.   All other systems reviewed and are negative.   Prior CV studies:   The following studies were reviewed today:    Labs/Other Tests and Data Reviewed:    EKG:    Recent Labs: 05/28/2023: ALT 15; BUN 5; Creatinine, Ser 0.61; Hemoglobin 11.8; Platelets 200; Potassium 3.9; Sodium 136   Recent Lipid Panel No results found for: "CHOL", "TRIG", "HDL", "CHOLHDL", "LDLCALC", "LDLDIRECT"  Wt Readings from Last 3 Encounters:  06/25/23 167 lb (75.8 kg)  06/11/23 167 lb 9.6 oz (76 kg)  05/28/23 165 lb (74.8 kg)     Risk Assessment/Calculations:          Objective:    Vital Signs:  Ht 5\' 6"  (1.676 m)   Wt 167 lb (75.8 kg)   LMP 03/28/2023   BMI 26.95 kg/m    VITAL SIGNS:  reviewed   ASSESSMENT & PLAN:    Palpitations, unclear etiology: Event monitor is unremarkable, average HR 86 bpm, no atrial or ventricular arrhythmias, no AV block or pauses.  Less than 1% PAC and PVC burden.  Patient triggered events correlated with normal sinus rhythm (71 to 112 bpm), SVE and VE.  I discussed the event monitor results with the patient in detail and answered all her questions.  She conceived recently and her palpitations  decreased in frequency.  Currently occurring 6 times per week compared to 20-30 times on the day.  Her palpitations are described as her heart skipping a beat.  No indication to start any rate controlling agents at this time.    I spent total duration of 10 minutes reviewing the event monitor results, discussion Addition of the event monitor findings, answering all the questions patient had and documenting the findings in the note.   Time:   Today, I have spent 10 minutes with the patient with telehealth technology discussing the above problems.     Medication Adjustments/Labs and Tests Ordered: Current medicines are reviewed at length with the patient today.  Concerns regarding medicines are outlined above.   Tests Ordered: No orders of the defined types were placed in this encounter.   Medication Changes: No orders of the defined types were placed in this encounter.   Follow Up: PRN  Signed, Marjo Bicker, MD  06/25/2023 12:55 PM    [facility]

## 2023-06-25 NOTE — Telephone Encounter (Signed)
  Patient Consent for Virtual Visit  [id number]}   Marilyn Howard has provided verbal consent on 06/25/2023 for a virtual visit (video or telephone).   CONSENT FOR VIRTUAL VISIT FOR:  Marilyn Howard  By participating in this virtual visit I agree to the following:  I hereby voluntarily request, consent and authorize [facility] and its employed or contracted physicians, physician assistants, nurse practitioners or other licensed health care professionals (the Practitioner), to provide me with telemedicine health care services (the "Services") as deemed necessary by the treating Practitioner. I acknowledge and consent to receive the Services by the Practitioner via telemedicine. I understand that the telemedicine visit will involve communicating with the Practitioner through live audiovisual communication technology and the disclosure of certain medical information by electronic transmission. I acknowledge that I have been given the opportunity to request an in-person assessment or other available alternative prior to the telemedicine visit and am voluntarily participating in the telemedicine visit.  I understand that I have the right to withhold or withdraw my consent to the use of telemedicine in the course of my care at any time, without affecting my right to future care or treatment, and that the Practitioner or I may terminate the telemedicine visit at any time. I understand that I have the right to inspect all information obtained and/or recorded in the course of the telemedicine visit and may receive copies of available information for a reasonable fee.  I understand that some of the potential risks of receiving the Services via telemedicine include:  Delay or interruption in medical evaluation due to technological equipment failure or disruption; Information transmitted may not be sufficient (e.g. poor resolution of images) to allow for appropriate medical decision making by the Practitioner;  and/or  In rare instances, security protocols could fail, causing a breach of personal health information.  Furthermore, I acknowledge that it is my responsibility to provide information about my medical history, conditions and care that is complete and accurate to the best of my ability. I acknowledge that Practitioner's advice, recommendations, and/or decision may be based on factors not within their control, such as incomplete or inaccurate data provided by me or distortions of diagnostic images or specimens that may result from electronic transmissions. I understand that the practice of medicine is not an exact science and that Practitioner makes no warranties or guarantees regarding treatment outcomes. I acknowledge that a copy of this consent can be made available to me via my patient portal [facility] MyChart), or I can request a printed copy by calling the office of [facility].    I understand that my insurance will be billed for this visit.   I have read or had this consent read to me. I understand the contents of this consent, which adequately explains the benefits and risks of the Services being provided via telemedicine.  I have been provided ample opportunity to ask questions regarding this consent and the Services and have had my questions answered to my satisfaction. I give my informed consent for the services to be provided through the use of telemedicine in my medical care

## 2023-06-25 NOTE — Patient Instructions (Signed)
Medication Instructions:  Your physician recommends that you continue on your current medications as directed. Please refer to the Current Medication list given to you today.  *If you need a refill on your cardiac medications before your next appointment, please call your pharmacy*   Lab Work: None If you have labs (blood work) drawn today and your tests are completely normal, you will receive your results only by: MyChart Message (if you have MyChart) OR A paper copy in the mail If you have any lab test that is abnormal or we need to change your treatment, we will call you to review the results.   Testing/Procedures: None   Follow-Up: At [facility], you and your health needs are our priority.  As part of our continuing mission to provide you with exceptional heart care, we have created designated Provider Care Teams.  These Care Teams include your primary Cardiologist (physician) and Advanced Practice Providers (APPs -  Physician Assistants and Nurse Practitioners) who all work together to provide you with the care you need, when you need it.  We recommend signing up for the patient portal called "MyChart".  Sign up information is provided on this After Visit Summary.  MyChart is used to connect with patients for Virtual Visits (Telemedicine).  Patients are able to view lab/test results, encounter notes, upcoming appointments, etc.  Non-urgent messages can be sent to your provider as well.   To learn more about what you can do with MyChart, go to [email or web address].    Your next appointment:    Follow up as needed.   Provider:   Luane School, MD    Other Instructions

## 2023-06-29 ENCOUNTER — Ambulatory Visit (INDEPENDENT_AMBULATORY_CARE_PROVIDER_SITE_OTHER): Payer: Medicaid Other | Admitting: Advanced Practice Midwife

## 2023-06-29 ENCOUNTER — Encounter: Payer: Self-pay | Admitting: Advanced Practice Midwife

## 2023-06-29 VITALS — BP 123/80 | HR 95 | Wt 169.6 lb

## 2023-06-29 DIAGNOSIS — Z86718 Personal history of other venous thrombosis and embolism: Secondary | ICD-10-CM

## 2023-06-29 DIAGNOSIS — Z3A11 11 weeks gestation of pregnancy: Secondary | ICD-10-CM | POA: Diagnosis not present

## 2023-06-29 DIAGNOSIS — O368310 Maternal care for abnormalities of the fetal heart rate or rhythm, first trimester, not applicable or unspecified: Secondary | ICD-10-CM

## 2023-06-29 DIAGNOSIS — Z1339 Encounter for screening examination for other mental health and behavioral disorders: Secondary | ICD-10-CM | POA: Diagnosis not present

## 2023-06-29 DIAGNOSIS — O0991 Supervision of high risk pregnancy, unspecified, first trimester: Secondary | ICD-10-CM | POA: Diagnosis not present

## 2023-06-29 DIAGNOSIS — Z8759 Personal history of other complications of pregnancy, childbirth and the puerperium: Secondary | ICD-10-CM | POA: Diagnosis not present

## 2023-06-29 DIAGNOSIS — F17210 Nicotine dependence, cigarettes, uncomplicated: Secondary | ICD-10-CM | POA: Insufficient documentation

## 2023-06-29 DIAGNOSIS — O0990 Supervision of high risk pregnancy, unspecified, unspecified trimester: Secondary | ICD-10-CM

## 2023-06-29 MED ORDER — ENOXAPARIN SODIUM 40 MG/0.4ML IJ SOSY
40.0000 mg | PREFILLED_SYRINGE | INTRAMUSCULAR | 30 refills | Status: DC
Start: 2023-06-29 — End: 2023-08-15
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
## 2023-06-30 LAB — COMPREHENSIVE METABOLIC PANEL
ALT: 7 [IU]/L (ref 0–32)
AST: 11 [IU]/L (ref 0–40)
Albumin: 4.1 g/dL (ref 4.0–5.0)
Alkaline Phosphatase: 67 [IU]/L (ref 44–121)
BUN/Creatinine Ratio: 13 (ref 9–23)
BUN: 7 mg/dL (ref 6–20)
Bilirubin Total: <0.2 mg/dL (ref 0.0–1.2)
CO2: 21 mmol/L (ref 20–29)
Calcium: 9.2 mg/dL (ref 8.7–10.2)
Chloride: 104 mmol/L (ref 96–106)
Creatinine, Ser: 0.53 mg/dL — ABNORMAL LOW (ref 0.57–1.00)
Globulin, Total: 2.8 g/dL (ref 1.5–4.5)
Glucose: 73 mg/dL (ref 70–99)
Potassium: 4 mmol/L (ref 3.5–5.2)
Sodium: 137 mmol/L (ref 134–144)
Total Protein: 6.9 g/dL (ref 6.0–8.5)
eGFR: 130 mL/min/{1.73_m2} (ref >59)

## 2023-06-30 LAB — CBC/D/PLT+RPR+RH+ABO+RUBIGG...
Antibody Screen: NEGATIVE
Basophils Absolute: 0 10*3/uL (ref 0.0–0.2)
Basos: 0 %
EOS (ABSOLUTE): 0 10*3/uL (ref 0.0–0.4)
Eos: 1 %
HCV Ab: NONREACTIVE
HIV Screen 4th Generation wRfx: NONREACTIVE
Hematocrit: 35.3 % (ref 34.0–46.6)
Hemoglobin: 11.6 g/dL (ref 11.1–15.9)
Hepatitis B Surface Ag: NEGATIVE
Immature Grans (Abs): 0 10*3/uL (ref 0.0–0.1)
Immature Granulocytes: 1 %
Lymphocytes Absolute: 1.7 10*3/uL (ref 0.7–3.1)
Lymphs: 21 %
MCH: 29.4 pg (ref 26.6–33.0)
MCHC: 32.9 g/dL (ref 31.5–35.7)
MCV: 90 fL (ref 79–97)
Monocytes Absolute: 0.5 10*3/uL (ref 0.1–0.9)
Monocytes: 6 %
Neutrophils Absolute: 5.9 10*3/uL (ref 1.4–7.0)
Neutrophils: 71 %
Platelets: 204 10*3/uL (ref 150–450)
RBC: 3.94 x10E6/uL (ref 3.77–5.28)
RDW: 13.1 % (ref 11.7–15.4)
RPR Ser Ql: NONREACTIVE
Rh Factor: POSITIVE
Rubella Antibodies, IGG: 1.09 {index} (ref >0.99)
WBC: 8.1 10*3/uL (ref 3.4–10.8)

## 2023-06-30 LAB — HCV INTERPRETATION

## 2023-07-01 LAB — URINE CULTURE, OB REFLEX

## 2023-07-01 LAB — CULTURE, OB URINE

## 2023-07-03 ENCOUNTER — Ambulatory Visit: Payer: Medicaid Other | Admitting: Cardiology

## 2023-07-05 LAB — PANORAMA PRENATAL TEST FULL PANEL:PANORAMA TEST PLUS 5 ADDITIONAL MICRODELETIONS: FETAL FRACTION: 9

## 2023-07-06 ENCOUNTER — Ambulatory Visit: Payer: Medicaid Other

## 2023-07-06 LAB — HORIZON CUSTOM: REPORT SUMMARY: NEGATIVE

## 2023-07-14 ENCOUNTER — Encounter: Payer: Self-pay | Admitting: Advanced Practice Midwife

## 2023-07-16 ENCOUNTER — Other Ambulatory Visit: Payer: Self-pay

## 2023-07-16 DIAGNOSIS — M792 Neuralgia and neuritis, unspecified: Secondary | ICD-10-CM

## 2023-07-16 DIAGNOSIS — M542 Cervicalgia: Secondary | ICD-10-CM

## 2023-07-22 ENCOUNTER — Encounter: Payer: Self-pay | Admitting: Obstetrics and Gynecology

## 2023-07-23 ENCOUNTER — Encounter: Payer: Self-pay | Admitting: Advanced Practice Midwife

## 2023-07-27 ENCOUNTER — Ambulatory Visit (INDEPENDENT_AMBULATORY_CARE_PROVIDER_SITE_OTHER): Payer: Medicaid Other | Admitting: Advanced Practice Midwife

## 2023-07-27 VITALS — BP 134/86 | HR 98 | Wt 169.2 lb

## 2023-07-27 DIAGNOSIS — R002 Palpitations: Secondary | ICD-10-CM

## 2023-07-27 DIAGNOSIS — Z8759 Personal history of other complications of pregnancy, childbirth and the puerperium: Secondary | ICD-10-CM

## 2023-07-27 DIAGNOSIS — O0990 Supervision of high risk pregnancy, unspecified, unspecified trimester: Secondary | ICD-10-CM

## 2023-07-27 DIAGNOSIS — R519 Headache, unspecified: Secondary | ICD-10-CM

## 2023-07-27 DIAGNOSIS — O26892 Other specified pregnancy related conditions, second trimester: Secondary | ICD-10-CM

## 2023-07-27 DIAGNOSIS — Z86718 Personal history of other venous thrombosis and embolism: Secondary | ICD-10-CM

## 2023-07-27 DIAGNOSIS — Z3A15 15 weeks gestation of pregnancy: Secondary | ICD-10-CM
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
## 2023-07-28 ENCOUNTER — Ambulatory Visit: Payer: Medicaid Other | Attending: Cardiology | Admitting: Cardiology

## 2023-07-28 ENCOUNTER — Encounter: Payer: Self-pay | Admitting: Cardiology

## 2023-07-28 VITALS — BP 116/72 | HR 106 | Ht 66.0 in | Wt 168.6 lb

## 2023-07-28 DIAGNOSIS — I493 Ventricular premature depolarization: Secondary | ICD-10-CM

## 2023-07-28 DIAGNOSIS — Z3A15 15 weeks gestation of pregnancy: Secondary | ICD-10-CM

## 2023-07-28 DIAGNOSIS — R55 Syncope and collapse: Secondary | ICD-10-CM | POA: Diagnosis not present

## 2023-07-28 DIAGNOSIS — R42 Dizziness and giddiness: Secondary | ICD-10-CM | POA: Diagnosis not present

## 2023-07-28 NOTE — Patient Instructions (Signed)
Medication Instructions:  Your physician recommends that you continue on your current medications as directed. Please refer to the Current Medication list given to you today.  *If you need a refill on your cardiac medications before your next appointment, please call your pharmacy*    Testing/Procedures: Your physician has requested that you have an echocardiogram. Echocardiography is a painless test that uses sound waves to create images of your heart. It provides your doctor with information about the size and shape of your heart and how well your heart's chambers and valves are working. This procedure takes approximately one hour. There are no restrictions for this procedure. Please do NOT wear cologne, perfume, aftershave, or lotions (deodorant is allowed). Please arrive 15 minutes prior to your appointment time.  Please note: We ask at that you not bring children with you during ultrasound (echo/ vascular) testing. Due to room size and safety concerns, children are not allowed in the ultrasound rooms during exams. Our front office staff cannot provide observation of children in our lobby area while testing is being conducted. An adult accompanying a patient to their appointment will only be allowed in the ultrasound room at the discretion of the ultrasound technician under special circumstances. We apologize for any inconvenience.    Follow-Up: At [facility], you and your health needs are our priority.  As part of our continuing mission to provide you with exceptional heart care, we have created designated Provider Care Teams.  These Care Teams include your primary Cardiologist (physician) and Advanced Practice Providers (APPs -  Physician Assistants and Nurse Practitioners) who all work together to provide you with the care you need, when you need it.  Your next appointment:   12 week(s)  Provider:   Thomasene Ripple, DO  Other instructions:  You are invited to attend: Women's  Heart Community event on February Friday 7th 2025 at [facility] ([address] [place], [place], [place] [address]) from 8am-12pm. Feel free to invite other women to attend!  See you there!

## 2023-07-29 LAB — AFP, SERUM, OPEN SPINA BIFIDA
AFP MoM: 1.16
AFP Value: 31.1 ng/mL
Gest. Age on Collection Date: 15 wk
Maternal Age At EDD: 28 a
OSBR Risk 1 IN: 7366
Test Results:: NEGATIVE
Weight: 169 [lb_av]

## 2023-07-31 NOTE — Progress Notes (Signed)
Cardio-Obstetrics Clinic  New Evaluation  Date:  07/31/2023   ID:  Marilyn Howard, DOB [birth year], MRN [id number]  PCP:  [facility]   [facility] Providers Cardiologist:  Marjo Bicker, MD  Electrophysiologist:  None       Referring MD: Milas Hock, MD   Chief Complaint: " I am here at the request of my OBGYN"  History of Present Illness:    Marilyn Howard is a 27 y.o. female [G2P1001] who is being seen today for the evaluation of palpitations at the request of Milas Hock, MD.   She reports heart palpitations since adolescence, reports an increase in frequency and intensity of palpitations since her first pregnancy in 2021. She describes the palpitations as feeling "like somebody's literally punching me in my chest" and occurring up to 60 times a day.   The patient also reports episodes of dizziness and lightheadedness associated with the palpitations. She has identified potential triggers such as acid reflux, gas, and certain foods like chocolate. The patient also has a history of anemia and takes iron, B12, magnesium, and CoQ10 supplements intermittently. She is currently [redacted] weeks pregnant and has noticed an increase in palpitations, which she attributes to the pregnancy and increased caffeine intake due to pregnancy-related headaches and nausea. The patient also has a history of anxiety and panic attacks, which she believes may exacerbate the palpitations. She has a family history of atrial fibrillation in her grandfather.   Prior CV Studies Reviewed: The following studies were reviewed today: zio  Past Medical History:  Diagnosis Date   Anemia    DVT (deep venous thrombosis) (HCC) 2016   Heart murmur    Hip bursitis    Palpitations 03/05/2023    Past Surgical History:  Procedure Laterality Date   CESAREAN SECTION     TONSILLECTOMY     and adenoids      OB History     Gravida  2   Para  1   Term  1    Preterm  0   AB  0   Living  1      SAB  0   IAB  0   Ectopic  0   Multiple  0   Live Births  1               Current Medications: Current Meds  Medication Sig   acetaminophen (TYLENOL) 500 MG tablet Take 500 mg by mouth every 6 (six) hours as needed for moderate pain.   Blood Pressure Monitoring (BLOOD PRESSURE KIT) DEVI 1 Device by Does not apply route once a week.   cyclobenzaprine (FLEXERIL) 10 MG tablet Take 10 mg by mouth 3 (three) times daily as needed.   enoxaparin (LOVENOX) 40 MG/0.4ML injection Inject 0.4 mLs (40 mg total) into the skin daily.   Prenatal Vit-Fe Fumarate-FA (MULTIVITAMIN-PRENATAL) 27-0.8 MG TABS tablet Take 1 tablet by mouth daily at 12 noon. Olly Gummies     Allergies:   Patient has no known allergies.   Social History   Socioeconomic History   Marital status: Single    Spouse name: Not on file   Number of children: Not on file   Years of education: Not on file   Highest education level: Not on file  Occupational History   Not on file  Tobacco Use   Smoking status: Former    Current packs/day: 0.00    Types: Cigarettes    Quit date:  2016    Years since quitting: 8.8   Smokeless tobacco: Never  Vaping Use   Vaping status: Never Used  Substance and Sexual Activity   Alcohol use: Not Currently   Drug use: Not Currently   Sexual activity: Yes  Other Topics Concern   Not on file  Social History Narrative   Not on file   Social Determinants of Health   Financial Resource Strain: Not on file  Food Insecurity: No Food Insecurity (11/04/2021)   [facility]   Hunger Vital Sign    Worried About Running Out of Food in the Last Year: Never true    Ran Out of Food in the Last Year: Never true  Transportation Needs: Not on file  Physical Activity: Not on file  Stress: No Stress Concern Present (04/23/2022)   [facility] visits prior to 11/08/2022., [facility] prior to 11/08/2022.   [employer] of Occupational Health - Occupational Stress Questionnaire    Feeling of Stress : Not at all  Social Connections: Unknown (01/17/2022)   [facility] from [facility], [facility]   Social Network    Social Network: Not on file      Family History  Problem Relation Age of Onset   Graves' disease Mother       ROS:   Please see the history of present illness.    palpitations All other systems reviewed and are negative.   Labs/EKG Reviewed:    EKG:   EKG was not ordered today.    Recent Labs: 06/29/2023: ALT 7; BUN 7; Creatinine, Ser 0.53; Hemoglobin 11.6; Platelets 204; Potassium 4.0; Sodium 137   Recent Lipid Panel No results found for: "CHOL", "TRIG", "HDL", "CHOLHDL", "LDLCALC", "LDLDIRECT"  Physical Exam:    VS:  BP 116/72 (BP Location: Right Arm, Patient Position: Sitting, Cuff Size: Normal)   Pulse (!) 106   Ht 5\' 6"  (1.676 m)   Wt 168 lb 9.6 oz (76.5 kg)   LMP 03/28/2023   SpO2 98%   BMI 27.21 kg/m     Wt Readings from Last 3 Encounters:  07/28/23 168 lb 9.6 oz (76.5 kg)  07/27/23 169 lb 3.2 oz (76.7 kg)  06/29/23 169 lb 9.6 oz (76.9 kg)     GEN:  Well nourished, well developed in no acute distress HEENT: Normal NECK: No JVD; No carotid bruits LYMPHATICS: No lymphadenopathy CARDIAC: RRR, no murmurs, rubs, gallops RESPIRATORY:  Clear to auscultation without rales, wheezing or rhonchi  ABDOMEN: Soft, non-tender, non-distended MUSCULOSKELETAL:  No edema; No deformity  SKIN: Warm and dry NEUROLOGIC:  Alert and oriented x 3 PSYCHIATRIC:  Normal affect    Risk Assessment/Risk Calculators:     CARPREG II Risk Prediction Index Score:  1.  The patient's risk for a primary cardiac event is 5%.   Modified World Health Organization [facility]) Classification of Maternal CV Risk   Class I         ASSESSMENT & PLAN:    Palpitations Frequent palpitations since  teenage years, increased frequency postpartum and during current pregnancy (15 weeks). Episodes vary in intensity and duration, sometimes associated with dizziness. Possible triggers include certain foods, gas, and anxiety. Previous EKGs normal, palpitations described as PVCs by previous cardiologist. Family history of atrial fibrillation. -Order echocardiogram to evaluate cardiac structure and function. -Consider low dose propranolol if palpitations increase in frequency or intensity during pregnancy.  Anxiety Reports  high anxiety, which may exacerbate palpitations. -Continue current management strategies, reassess as needed.  Anemia Reports history of anemia, inconsistent iron supplementation. -Encourage consistent iron supplementation, especially given pregnancy status.  Pregnancy Currently [redacted] weeks pregnant, experiencing increased palpitations. -Monitor closely throughout pregnancy, particularly in relation to palpitations and potential anemia.  Follow-up Plan to continue care throughout pregnancy and postpartum period, with a focus on managing palpitations.  Patient Instructions  Medication Instructions:  Your physician recommends that you continue on your current medications as directed. Please refer to the Current Medication list given to you today.  *If you need a refill on your cardiac medications before your next appointment, please call your pharmacy*    Testing/Procedures: Your physician has requested that you have an echocardiogram. Echocardiography is a painless test that uses sound waves to create images of your heart. It provides your doctor with information about the size and shape of your heart and how well your heart's chambers and valves are working. This procedure takes approximately one hour. There are no restrictions for this procedure. Please do NOT wear cologne, perfume, aftershave, or lotions (deodorant is allowed). Please arrive 15 minutes prior to your  appointment time.  Please note: We ask at that you not bring children with you during ultrasound (echo/ vascular) testing. Due to room size and safety concerns, children are not allowed in the ultrasound rooms during exams. Our front office staff cannot provide observation of children in our lobby area while testing is being conducted. An adult accompanying a patient to their appointment will only be allowed in the ultrasound room at the discretion of the ultrasound technician under special circumstances. We apologize for any inconvenience.    Follow-Up: At [facility], you and your health needs are our priority.  As part of our continuing mission to provide you with exceptional heart care, we have created designated Provider Care Teams.  These Care Teams include your primary Cardiologist (physician) and Advanced Practice Providers (APPs -  Physician Assistants and Nurse Practitioners) who all work together to provide you with the care you need, when you need it.  Your next appointment:   12 week(s)  Provider:   Thomasene Ripple, DO  Other instructions:  You are invited to attend: Women's Heart Community event on February Friday 7th 2025 at [facility] ([address] [place], [place], [place] [address]) from 8am-12pm. Feel free to invite other women to attend!  See you there!     Dispo:  No follow-ups on file.   Medication Adjustments/Labs and Tests Ordered: Current medicines are reviewed at length with the patient today.  Concerns regarding medicines are outlined above.  Tests Ordered: Orders Placed This Encounter  Procedures   ECHOCARDIOGRAM COMPLETE   Medication Changes: No orders of the defined types were placed in this encounter.

## 2023-08-10 ENCOUNTER — Ambulatory Visit (HOSPITAL_COMMUNITY): Payer: Medicaid Other | Attending: Cardiology

## 2023-08-10 DIAGNOSIS — R55 Syncope and collapse: Secondary | ICD-10-CM | POA: Insufficient documentation

## 2023-08-10 DIAGNOSIS — R42 Dizziness and giddiness: Secondary | ICD-10-CM

## 2023-08-10 LAB — ECHOCARDIOGRAM COMPLETE: S' Lateral: 2.67 cm

## 2023-08-15 ENCOUNTER — Other Ambulatory Visit: Payer: Self-pay | Admitting: Obstetrics and Gynecology

## 2023-08-15 DIAGNOSIS — Z86718 Personal history of other venous thrombosis and embolism: Secondary | ICD-10-CM

## 2023-08-15 MED ORDER — ENOXAPARIN SODIUM 40 MG/0.4ML IJ SOSY: 40.0000 mg | PREFILLED_SYRINGE | INTRAMUSCULAR | 1 refills | Status: DC

## 2023-08-15 NOTE — Progress Notes (Signed)
Received call from RN line requesting lovenox refill, sent to pharamcy requsted.

## 2023-08-21 ENCOUNTER — Encounter: Payer: Self-pay | Admitting: Advanced Practice Midwife

## 2023-08-24 ENCOUNTER — Other Ambulatory Visit: Payer: Self-pay | Admitting: *Deleted

## 2023-08-24 ENCOUNTER — Ambulatory Visit: Payer: Medicaid Other | Attending: Obstetrics and Gynecology

## 2023-08-24 ENCOUNTER — Encounter: Payer: Self-pay | Admitting: Obstetrics and Gynecology

## 2023-08-24 ENCOUNTER — Other Ambulatory Visit (HOSPITAL_COMMUNITY)
Admission: RE | Admit: 2023-08-24 | Discharge: 2023-08-24 | Disposition: A | Discharge status: Home / Self Care | Payer: Medicaid Other | Source: Ambulatory Visit | Attending: Obstetrics and Gynecology | Admitting: Obstetrics and Gynecology

## 2023-08-24 ENCOUNTER — Encounter: Payer: Self-pay | Admitting: *Deleted

## 2023-08-24 ENCOUNTER — Ambulatory Visit: Payer: Medicaid Other | Admitting: *Deleted

## 2023-08-24 ENCOUNTER — Ambulatory Visit (INDEPENDENT_AMBULATORY_CARE_PROVIDER_SITE_OTHER): Payer: Medicaid Other | Admitting: Certified Nurse Midwife

## 2023-08-24 VITALS — BP 132/78 | HR 114 | Wt 175.8 lb

## 2023-08-24 DIAGNOSIS — O09892 Supervision of other high risk pregnancies, second trimester: Secondary | ICD-10-CM

## 2023-08-24 DIAGNOSIS — O34219 Maternal care for unspecified type scar from previous cesarean delivery: Secondary | ICD-10-CM

## 2023-08-24 DIAGNOSIS — Z86718 Personal history of other venous thrombosis and embolism: Secondary | ICD-10-CM

## 2023-08-24 DIAGNOSIS — N898 Other specified noninflammatory disorders of vagina: Secondary | ICD-10-CM | POA: Insufficient documentation

## 2023-08-24 DIAGNOSIS — O09292 Supervision of pregnancy with other poor reproductive or obstetric history, second trimester: Secondary | ICD-10-CM

## 2023-08-24 DIAGNOSIS — O0992 Supervision of high risk pregnancy, unspecified, second trimester: Secondary | ICD-10-CM

## 2023-08-24 DIAGNOSIS — R9389 Abnormal findings on diagnostic imaging of other specified body structures: Secondary | ICD-10-CM | POA: Insufficient documentation

## 2023-08-24 DIAGNOSIS — O0990 Supervision of high risk pregnancy, unspecified, unspecified trimester: Secondary | ICD-10-CM | POA: Insufficient documentation

## 2023-08-24 DIAGNOSIS — Z8759 Personal history of other complications of pregnancy, childbirth and the puerperium: Secondary | ICD-10-CM

## 2023-08-24 DIAGNOSIS — Z3A19 19 weeks gestation of pregnancy: Secondary | ICD-10-CM | POA: Diagnosis not present

## 2023-08-24 DIAGNOSIS — Z98891 History of uterine scar from previous surgery: Secondary | ICD-10-CM | POA: Insufficient documentation

## 2023-08-24 LAB — POCT URINALYSIS DIPSTICK
Bilirubin, UA: NEGATIVE — NL
Blood, UA: NEGATIVE
Glucose, UA: NEGATIVE — NL
Ketones, UA: NEGATIVE — NL
Nitrite, UA: NEGATIVE
Protein, UA: NEGATIVE
Spec Grav, UA: 1.01 (ref 1.010–1.025)
Urobilinogen, UA: >=8 U/dL — AB
pH, UA: 6 (ref 5.0–8.0)

## 2023-08-24 MED ORDER — NYSTATIN-TRIAMCINOLONE 100000-0.1 UNIT/GM-% EX CREA: TOPICAL_CREAM | Freq: Two times a day (BID) | CUTANEOUS | Status: DC

## 2023-08-24 NOTE — Progress Notes (Signed)
C/O vaginal discharge wants checked for infection. Slight irritation with urination.

## 2023-08-24 NOTE — Progress Notes (Signed)
   PRENATAL VISIT NOTE  Subjective:  Marilyn Howard is a 27 y.o. G2P1001 at [redacted]w[redacted]d being seen today for ongoing prenatal care.  She is currently monitored for the following issues for this high-risk pregnancy and has Supervision of high risk pregnancy, antepartum; GAD (generalized anxiety disorder); History of DVT (deep vein thrombosis); Hypersomnia; Polyarthralgia; Vitamin D deficiency; and History of placenta abruption on their problem list.  Patient reports vaginal irritation with redness and itching and painful "burning with" urination. She also reports a thick white vaginal discharge and an odor.  .  Contractions: Irregular. Vag. Bleeding: None.  Movement: Present. Denies leaking of fluid.   The following portions of the patient's history were reviewed and updated as appropriate: allergies, current medications, past family history, past medical history, past social history, past surgical history and problem list.   Objective:   Vitals:   08/24/23 1140  BP: 132/78  Pulse: (!) 114  Weight: 175 lb 12.8 oz (79.7 kg)    Fetal Status: Fetal Heart Rate (bpm): 148   Movement: Present     General:  Alert, oriented and cooperative. Patient is in no acute distress.  Skin: Skin is warm and dry. No rash noted.   Cardiovascular: Normal heart rate noted  Respiratory: Normal respiratory effort, no problems with respiration noted  Abdomen: Soft, gravid, appropriate for gestational age.  Pain/Pressure: Absent     Pelvic: Cervical exam deferred        Extremities: Normal range of motion.  Edema: None  Mental Status: Normal mood and affect. Normal behavior. Normal judgment and thought content.   Assessment and Plan:  Pregnancy: G2P1001 at [redacted]w[redacted]d 1. Supervision of high risk pregnancy in second trimester (Primary) - Patient doing well.  - Reports inconsistent movements but notes movement everyday.  - Reviewed normal 2nd trimester expectations for movement.  - FOB "Makel" present for visit and  supportive.   2. [redacted] weeks gestation of pregnancy - Patient reports improvement in N/V and states that her appetite has improved as well  3. Vaginal irritation - Patient request self-swab today.  - Rx for Nystatin cream sent to to outpatient pharmacy to use externally  - UA positive for Leuks in the office, Reflexed to urine culture.   4. History of DVT (deep vein thrombosis) - Patient currently on Daily lovenox injection.  - Reviewed importance of alternating injection locations on the abdomen, to decrease soreness, tenderness and bruising to the area.  - Patient verbalized understanding   Preterm labor symptoms and general obstetric precautions including but not limited to vaginal bleeding, contractions, leaking of fluid and fetal movement were reviewed in detail with the patient. Please refer to After Visit Summary for other counseling recommendations.   Return in about 4 weeks (around 09/21/2023) for HROB.  Future Appointments  Date Time Provider Department Center  08/24/2023  1:15 PM [facility] NURSE [facility]  08/24/2023  1:30 PM [facility] US3 [facility]   Etter Royall Danella Deis) Suzie Portela, MSN, CNM  Center for [facility]  08/24/2023 12:28 PM

## 2023-08-26 ENCOUNTER — Other Ambulatory Visit: Payer: Self-pay | Admitting: *Deleted

## 2023-08-26 ENCOUNTER — Encounter: Payer: Self-pay | Admitting: Certified Nurse Midwife

## 2023-08-26 LAB — CERVICOVAGINAL ANCILLARY ONLY
Bacterial Vaginitis (gardnerella): POSITIVE — AB
Candida Glabrata: NEGATIVE
Candida Vaginitis: POSITIVE — AB
Chlamydia: NEGATIVE
Comment: NEGATIVE
Comment: NEGATIVE
Comment: NEGATIVE
Comment: NEGATIVE
Comment: NEGATIVE
Comment: NORMAL
Neisseria Gonorrhea: NEGATIVE
Trichomonas: NEGATIVE

## 2023-08-26 LAB — URINE CULTURE

## 2023-08-26 MED ORDER — METRONIDAZOLE 500 MG PO TABS: 500.0000 mg | ORAL_TABLET | Freq: Two times a day (BID) | ORAL | 0 refills | Status: DC

## 2023-08-26 MED ORDER — TERCONAZOLE 0.4 % VA CREA: 1.0000 | TOPICAL_CREAM | Freq: Every day | VAGINAL | 0 refills | Status: DC

## 2023-08-26 NOTE — Progress Notes (Signed)
Treatment for BV and yeast ordered  See lab results

## 2023-08-27 MED ORDER — METRONIDAZOLE 0.75 % VA GEL: 1.0000 | Freq: Every day | VAGINAL | 1 refills | Status: DC

## 2023-08-27 NOTE — Addendum Note (Signed)
Addended by: Carlynn Herald on: 08/27/2023 11:24 PM   Modules accepted: Orders

## 2023-08-28 ENCOUNTER — Encounter: Payer: Self-pay | Admitting: Certified Nurse Midwife

## 2023-09-03 ENCOUNTER — Other Ambulatory Visit (HOSPITAL_COMMUNITY): Payer: Medicaid Other

## 2023-09-09 NOTE — L&D Delivery Note (Signed)
 OB/GYN Faculty Practice Delivery Note  LEONA ROSENE is a 28 y.o. [id number] s/p VBAC at [redacted]w[redacted]d. She was admitted for IOL due to hx DVT.   ROM: 5h 12m with clear fluid GBS Status: Positive/-- (04/14 1032)  Maximum Maternal Temperature: 99.49F   Labor Progress: Initial SVE: 4/70/-3. She then progressed to complete.   Delivery Date/Time: 01/17/24 0046 Delivery: Called to room and patient was complete and pushing. Head delivered LOA. No nuchal cord present. Shoulder and body delivered in usual fashion. Infant with spontaneous cry, placed on mother's abdomen, dried and stimulated. Cord clamped x 2 after cord stopped pulsating, and cut by patient. Cord blood drawn. Placenta delivered spontaneously with gentle cord traction. Fundus firm with massage and Pitocin. Bladder emptied with 100cc drained. Labia, perineum, vagina, and cervix inspected inspected with a second degree laceration noted, which was repaired in the usual fashion.  Baby Weight: pending  Placenta: Sent to L&D Complications: None Lacerations: Second degree, repaired EBL: 550 mL Analgesia: Epidural and local anesthesia  Infant:  APGAR (1 MIN): 9  APGAR (5 MINS): 9  Melanie Spires, MD OB Fellow, Faculty Practice [facility], Center for [facility]

## 2023-09-28 ENCOUNTER — Ambulatory Visit: Payer: Medicaid Other

## 2023-09-29 ENCOUNTER — Other Ambulatory Visit: Payer: Self-pay | Admitting: *Deleted

## 2023-09-29 ENCOUNTER — Ambulatory Visit: Payer: Medicaid Other | Attending: Obstetrics and Gynecology

## 2023-09-29 ENCOUNTER — Other Ambulatory Visit: Payer: Self-pay

## 2023-09-29 ENCOUNTER — Ambulatory Visit (HOSPITAL_BASED_OUTPATIENT_CLINIC_OR_DEPARTMENT_OTHER): Payer: Medicaid Other | Admitting: Obstetrics

## 2023-09-29 DIAGNOSIS — O09892 Supervision of other high risk pregnancies, second trimester: Secondary | ICD-10-CM | POA: Diagnosis not present

## 2023-09-29 DIAGNOSIS — Z86718 Personal history of other venous thrombosis and embolism: Secondary | ICD-10-CM

## 2023-09-29 DIAGNOSIS — Z3A24 24 weeks gestation of pregnancy: Secondary | ICD-10-CM | POA: Diagnosis present

## 2023-09-29 DIAGNOSIS — O99412 Diseases of the circulatory system complicating pregnancy, second trimester: Secondary | ICD-10-CM

## 2023-09-29 DIAGNOSIS — Z8759 Personal history of other complications of pregnancy, childbirth and the puerperium: Secondary | ICD-10-CM | POA: Diagnosis present

## 2023-09-29 DIAGNOSIS — O09292 Supervision of pregnancy with other poor reproductive or obstetric history, second trimester: Secondary | ICD-10-CM | POA: Diagnosis not present

## 2023-09-29 DIAGNOSIS — O34219 Maternal care for unspecified type scar from previous cesarean delivery: Secondary | ICD-10-CM

## 2023-09-29 DIAGNOSIS — O43102 Malformation of placenta, unspecified, second trimester: Secondary | ICD-10-CM | POA: Insufficient documentation

## 2023-09-29 DIAGNOSIS — O2230 Deep phlebothrombosis in pregnancy, unspecified trimester: Secondary | ICD-10-CM

## 2023-09-29 DIAGNOSIS — I82409 Acute embolism and thrombosis of unspecified deep veins of unspecified lower extremity: Secondary | ICD-10-CM | POA: Diagnosis not present

## 2023-09-29 NOTE — Progress Notes (Signed)
MFM Consult Note  Marilyn Howard is currently at 24 weeks and 3 days.  She was seen for a follow-up exam due to placental lakes that were noted on her prior ultrasound exam.    Her past pregnancy history includes a C-section at 38 weeks following a failed induction/arrest of descent.  She is currently being treated with prophylactic Lovenox (40 mg daily) due to a history of a DVT while she was taking birth control pills about 10 years ago.  She denies any problems since her last exam.  She was informed that the fetal growth and amniotic fluid level appears appropriate for her gestational age.  A normal-appearing anterior placenta is noted without any signs of placenta previa.  There are a few placental lakes present.  However, this is most likely a normal variant.  She was advised that my suspicion for placenta accreta is low.  The patient has stated that she would prefer not to be induced for the delivery of this pregnancy.    To decrease her risk of hemorrhage at term, she may be switched to twice daily subcu heparin (10,000 units twice daily) at around 35 to 36 weeks.    As she is only treated with a prophylactic dose of anticoagulation, she may be eligible to receive regional anesthesia if it has been 12 hours or more since she received the anticoagulation medication.  The subcu heparin may also be reversed with protamine sulfate if necessary.  A follow-up growth scan was scheduled in 8 weeks.  We will assess the placenta again at that exam.    The patient stated that all of her questions were answered today.  A total of 20 minutes was spent counseling and coordinating the care for this patient.  Greater than 50% of the time was spent in direct face-to-face contact.

## 2023-10-22 ENCOUNTER — Telehealth: Payer: Self-pay | Admitting: *Deleted

## 2023-10-22 NOTE — Telephone Encounter (Signed)
Spoke with patient about scheduling and transfer from one office to another.

## 2023-10-30 ENCOUNTER — Encounter: Payer: Self-pay | Admitting: Obstetrics and Gynecology

## 2023-10-30 ENCOUNTER — Other Ambulatory Visit: Payer: Medicaid Other

## 2023-10-30 ENCOUNTER — Ambulatory Visit (INDEPENDENT_AMBULATORY_CARE_PROVIDER_SITE_OTHER): Payer: Medicaid Other | Admitting: Obstetrics and Gynecology

## 2023-10-30 VITALS — BP 120/75 | HR 93 | Wt 191.0 lb

## 2023-10-30 DIAGNOSIS — Z98891 History of uterine scar from previous surgery: Secondary | ICD-10-CM | POA: Diagnosis not present

## 2023-10-30 DIAGNOSIS — O0993 Supervision of high risk pregnancy, unspecified, third trimester: Secondary | ICD-10-CM

## 2023-10-30 DIAGNOSIS — Z3A28 28 weeks gestation of pregnancy: Secondary | ICD-10-CM

## 2023-10-30 DIAGNOSIS — Z1339 Encounter for screening examination for other mental health and behavioral disorders: Secondary | ICD-10-CM | POA: Diagnosis not present

## 2023-10-30 DIAGNOSIS — O0992 Supervision of high risk pregnancy, unspecified, second trimester: Secondary | ICD-10-CM

## 2023-10-30 DIAGNOSIS — O0990 Supervision of high risk pregnancy, unspecified, unspecified trimester: Secondary | ICD-10-CM

## 2023-10-30 DIAGNOSIS — Z86718 Personal history of other venous thrombosis and embolism: Secondary | ICD-10-CM | POA: Diagnosis not present

## 2023-10-30 NOTE — Progress Notes (Signed)
 ROB, c/o soft mucousy stool x 1 month,

## 2023-10-30 NOTE — Progress Notes (Signed)
   PRENATAL VISIT NOTE  Subjective:  Marilyn Howard is a 28 y.o. G2P1001 at [redacted]w[redacted]d being seen today for ongoing prenatal care.  She is currently monitored for the following issues for this high-risk pregnancy and has Supervision of high risk pregnancy, antepartum; GAD (generalized anxiety disorder); History of DVT (deep vein thrombosis); Hypersomnia; Polyarthralgia; Vitamin D deficiency; History of placenta abruption; History of cesarean delivery; and Abnormal ultrasound on their problem list.  Patient reports no complaints.  Contractions: Irritability. Vag. Bleeding: None.  Movement: Present. Denies leaking of fluid.   The following portions of the patient's history were reviewed and updated as appropriate: allergies, current medications, past family history, past medical history, past social history, past surgical history and problem list.   Objective:   Vitals:   10/30/23 0945  BP: 120/75  Pulse: 93  Weight: 191 lb (86.6 kg)    Fetal Status: Fetal Heart Rate (bpm): 151 Fundal Height: 29 cm Movement: Present     General:  Alert, oriented and cooperative. Patient is in no acute distress.  Skin: Skin is warm and dry. No rash noted.   Cardiovascular: Normal heart rate noted  Respiratory: Normal respiratory effort, no problems with respiration noted  Abdomen: Soft, gravid, appropriate for gestational age.  Pain/Pressure: Present     Pelvic: Cervical exam deferred        Extremities: Normal range of motion.  Edema: None  Mental Status: Normal mood and affect. Normal behavior. Normal judgment and thought content.   Assessment and Plan:  Pregnancy: G2P1001 at [redacted]w[redacted]d 1. Supervision of high risk pregnancy in second trimester (Primary) Patient is doing well without complaints Third trimester labs today with glucola Advised patient to take imodium to help with loose stools which have been present for several months, along with probiotics  2. [redacted] weeks gestation of pregnancy   3.  Supervision of high risk pregnancy, antepartum   4. History of DVT (deep vein thrombosis) Continue lovenox Patient does not want IOL. Will plan to switch to heparin around 36-37 weeks  5. History of cesarean delivery Plans TOLAC. Consent signed today following counseling  Preterm labor symptoms and general obstetric precautions including but not limited to vaginal bleeding, contractions, leaking of fluid and fetal movement were reviewed in detail with the patient. Please refer to After Visit Summary for other counseling recommendations.   Return in about 2 weeks (around 11/13/2023) for in person, ROB, High risk.  Future Appointments  Date Time Provider Department Center  10/30/2023 11:15 AM Senica Crall, Gigi Gin, MD [facility] None  11/24/2023  3:30 PM [facility]3 [facility]    Catalina Antigua, MD

## 2023-10-31 LAB — GLUCOSE TOLERANCE, 2 HOURS W/ 1HR
Glucose, 1 hour: 87 mg/dL (ref 70–179)
Glucose, 2 hour: 82 mg/dL (ref 70–152)
Glucose, Fasting: 71 mg/dL (ref 70–91)

## 2023-10-31 LAB — CBC
Hematocrit: 32.7 % — ABNORMAL LOW (ref 34.0–46.6)
Hemoglobin: 10.7 g/dL — ABNORMAL LOW (ref 11.1–15.9)
MCH: 29.6 pg (ref 26.6–33.0)
MCHC: 32.7 g/dL (ref 31.5–35.7)
MCV: 90 fL (ref 79–97)
Platelets: 199 10*3/uL (ref 150–450)
RBC: 3.62 x10E6/uL — ABNORMAL LOW (ref 3.77–5.28)
RDW: 12.6 % (ref 11.7–15.4)
WBC: 10.5 10*3/uL (ref 3.4–10.8)

## 2023-10-31 LAB — RPR: RPR Ser Ql: NONREACTIVE

## 2023-10-31 LAB — HIV ANTIBODY (ROUTINE TESTING W REFLEX): HIV Screen 4th Generation wRfx: NONREACTIVE

## 2023-11-03 ENCOUNTER — Encounter: Payer: Medicaid Other | Admitting: Obstetrics & Gynecology

## 2023-11-13 ENCOUNTER — Ambulatory Visit: Payer: Medicaid Other | Admitting: Obstetrics and Gynecology

## 2023-11-13 VITALS — BP 113/74 | HR 88 | Wt 183.4 lb

## 2023-11-13 DIAGNOSIS — O0992 Supervision of high risk pregnancy, unspecified, second trimester: Secondary | ICD-10-CM | POA: Diagnosis not present

## 2023-11-13 DIAGNOSIS — Z86718 Personal history of other venous thrombosis and embolism: Secondary | ICD-10-CM

## 2023-11-13 DIAGNOSIS — Z98891 History of uterine scar from previous surgery: Secondary | ICD-10-CM

## 2023-11-13 DIAGNOSIS — Z3A30 30 weeks gestation of pregnancy: Secondary | ICD-10-CM

## 2023-11-13 DIAGNOSIS — O0990 Supervision of high risk pregnancy, unspecified, unspecified trimester: Secondary | ICD-10-CM

## 2023-11-13 MED ORDER — METRONIDAZOLE 0.75 % VA GEL: 1.0000 | Freq: Every day | VAGINAL | 1 refills | Status: DC

## 2023-11-13 MED ORDER — TERCONAZOLE 0.4 % VA CREA: 1.0000 | TOPICAL_CREAM | Freq: Every day | VAGINAL | 0 refills | Status: DC

## 2023-11-13 NOTE — Progress Notes (Signed)
 Asking for refill for yeast infection. Was not told how often to use it. Pt questions wt loss and FHT.

## 2023-11-13 NOTE — Progress Notes (Signed)
   PRENATAL VISIT NOTE  Subjective:  Marilyn Howard is a 28 y.o. G2P1001 at [redacted]w[redacted]d being seen today for ongoing prenatal care.  She is currently monitored for the following issues for this high-risk pregnancy and has Supervision of high risk pregnancy, antepartum; GAD (generalized anxiety disorder); History of DVT (deep vein thrombosis); Hypersomnia; Polyarthralgia; Vitamin D deficiency; History of placenta abruption; History of cesarean delivery; and Abnormal ultrasound on their problem list.  Patient reports  vaginal d.c and itching, recurrent bv and yeast. Was unsure how often to do the cream  .  Contractions: Irregular. Vag. Bleeding: None.  Movement: Present. Denies leaking of fluid.   The following portions of the patient's history were reviewed and updated as appropriate: allergies, current medications, past family history, past medical history, past social history, past surgical history and problem list.   Objective:   Vitals:   11/13/23 0830  BP: 113/74  Pulse: 88  Weight: 183 lb 6.4 oz (83.2 kg)    Fetal Status: Fetal Heart Rate (bpm): 177   Movement: Present     General:  Alert, oriented and cooperative. Patient is in no acute distress.  Skin: Skin is warm and dry. No rash noted.   Cardiovascular: Normal heart rate noted  Respiratory: Normal respiratory effort, no problems with respiration noted  Abdomen: Soft, gravid, appropriate for gestational age.  Pain/Pressure: Absent     Pelvic: Cervical exam deferred        Extremities: Normal range of motion.  Edema: None  Mental Status: Normal mood and affect. Normal behavior. Normal judgment and thought content.   Assessment and Plan:  Pregnancy: G2P1001 at [redacted]w[redacted]d 1. Supervision of high risk pregnancy in second trimester (Primary) BP and FHR normal Doing well, feeling vigorous movement    2. History of DVT (deep vein thrombosis) On lovenox, will switch to heparin 35-36 weeks per MFM  Does not desire IOL  Follow up u/s  3/18  3. History of cesarean delivery Desires TOLAC, consent signed 10/30/23  4. [redacted] weeks gestation of pregnancy Discussed trial diflucan, will try gel and cream again and notify if symptoms continue.   Preterm labor symptoms and general obstetric precautions including but not limited to vaginal bleeding, contractions, leaking of fluid and fetal movement were reviewed in detail with the patient. Please refer to After Visit Summary for other counseling recommendations.   Return in two week for routine prenatal  Future Appointments  Date Time Provider Department Center  11/24/2023  3:30 PM [facility]  11/27/2023  8:55 AM Lennart Pall, MD [facility] None    Albertine Grates, FNP

## 2023-11-24 ENCOUNTER — Ambulatory Visit: Payer: Medicaid Other | Attending: Obstetrics

## 2023-11-24 DIAGNOSIS — Z3A32 32 weeks gestation of pregnancy: Secondary | ICD-10-CM

## 2023-11-24 DIAGNOSIS — O34219 Maternal care for unspecified type scar from previous cesarean delivery: Secondary | ICD-10-CM

## 2023-11-24 DIAGNOSIS — O2233 Deep phlebothrombosis in pregnancy, third trimester: Secondary | ICD-10-CM | POA: Diagnosis not present

## 2023-11-24 DIAGNOSIS — O2230 Deep phlebothrombosis in pregnancy, unspecified trimester: Secondary | ICD-10-CM | POA: Diagnosis present

## 2023-11-24 DIAGNOSIS — O09293 Supervision of pregnancy with other poor reproductive or obstetric history, third trimester: Secondary | ICD-10-CM | POA: Diagnosis not present

## 2023-11-27 ENCOUNTER — Ambulatory Visit: Admitting: Obstetrics and Gynecology

## 2023-11-27 NOTE — Progress Notes (Signed)
 Pt rescheduled her appointment

## 2023-12-02 ENCOUNTER — Ambulatory Visit (INDEPENDENT_AMBULATORY_CARE_PROVIDER_SITE_OTHER): Admitting: Obstetrics & Gynecology

## 2023-12-02 VITALS — BP 113/71 | HR 92 | Wt 195.0 lb

## 2023-12-02 DIAGNOSIS — O0990 Supervision of high risk pregnancy, unspecified, unspecified trimester: Secondary | ICD-10-CM

## 2023-12-02 DIAGNOSIS — Z86718 Personal history of other venous thrombosis and embolism: Secondary | ICD-10-CM | POA: Diagnosis not present

## 2023-12-02 NOTE — Progress Notes (Signed)
   PRENATAL VISIT NOTE  Subjective:  Marilyn Howard is a 28 y.o. G2P1001 at [redacted]w[redacted]d being seen today for ongoing prenatal care.  She is currently monitored for the following issues for this high-risk pregnancy and has Supervision of high risk pregnancy, antepartum; GAD (generalized anxiety disorder); History of DVT (deep vein thrombosis); Hypersomnia; Polyarthralgia; Vitamin D deficiency; History of placenta abruption; History of cesarean delivery; and Abnormal ultrasound on their problem list.  Patient reports no complaints.  Contractions: Irregular. Vag. Bleeding: None.  Movement: Present. Denies leaking of fluid.   The following portions of the patient's history were reviewed and updated as appropriate: allergies, current medications, past family history, past medical history, past social history, past surgical history and problem list.   Objective:   Vitals:   12/02/23 1402  BP: 113/71  Pulse: 92  Weight: 195 lb (88.5 kg)    Fetal Status: Fetal Heart Rate (bpm): 160 Fundal Height: 34 cm Movement: Present     General:  Alert, oriented and cooperative. Patient is in no acute distress.  Skin: Skin is warm and dry. No rash noted.   Cardiovascular: Normal heart rate noted  Respiratory: Normal respiratory effort, no problems with respiration noted  Abdomen: Soft, gravid, appropriate for gestational age.  Pain/Pressure: Present     Pelvic: Cervical exam deferred        Extremities: Normal range of motion.     Mental Status: Normal mood and affect. Normal behavior. Normal judgment and thought content.   Assessment and Plan:  Pregnancy: G2P1001 at [redacted]w[redacted]d 1. Supervision of high risk pregnancy, antepartum (Primary) Change to heparin 35-36 weeks  Preterm labor symptoms and general obstetric precautions including but not limited to vaginal bleeding, contractions, leaking of fluid and fetal movement were reviewed in detail with the patient. Please refer to After Visit Summary for other  counseling recommendations.   Return in about 2 weeks (around 12/16/2023).  Future Appointments  Date Time Provider Department Center  12/16/2023  1:30 PM Wanita Chamberlain, MD [facility] None  12/21/2023  8:35 AM Warden Fillers, MD [facility] None  12/28/2023  8:35 AM Brock Bad, MD [facility] None    Scheryl Darter, MD

## 2023-12-13 ENCOUNTER — Inpatient Hospital Stay (HOSPITAL_COMMUNITY)
Admission: AD | Admit: 2023-12-13 | Discharge: 2023-12-13 | Disposition: A | Discharge status: Home / Self Care | Attending: Obstetrics and Gynecology | Admitting: Obstetrics and Gynecology

## 2023-12-13 ENCOUNTER — Encounter (HOSPITAL_COMMUNITY): Payer: Self-pay | Admitting: Obstetrics and Gynecology

## 2023-12-13 ENCOUNTER — Inpatient Hospital Stay (HOSPITAL_COMMUNITY)

## 2023-12-13 DIAGNOSIS — Z711 Person with feared health complaint in whom no diagnosis is made: Secondary | ICD-10-CM | POA: Insufficient documentation

## 2023-12-13 DIAGNOSIS — Z3A35 35 weeks gestation of pregnancy: Secondary | ICD-10-CM

## 2023-12-13 DIAGNOSIS — Z8759 Personal history of other complications of pregnancy, childbirth and the puerperium: Secondary | ICD-10-CM

## 2023-12-13 DIAGNOSIS — O09293 Supervision of pregnancy with other poor reproductive or obstetric history, third trimester: Secondary | ICD-10-CM | POA: Diagnosis not present

## 2023-12-13 DIAGNOSIS — O368130 Decreased fetal movements, third trimester, not applicable or unspecified: Secondary | ICD-10-CM | POA: Diagnosis present

## 2023-12-13 DIAGNOSIS — R10812 Left upper quadrant abdominal tenderness: Secondary | ICD-10-CM | POA: Diagnosis present

## 2023-12-13 LAB — URINALYSIS, ROUTINE W REFLEX MICROSCOPIC
Bilirubin Urine: NEGATIVE
Glucose, UA: NEGATIVE mg/dL
Hgb urine dipstick: NEGATIVE
Ketones, ur: NEGATIVE mg/dL
Nitrite: NEGATIVE
Protein, ur: NEGATIVE mg/dL
Specific Gravity, Urine: 1.016 (ref 1.005–1.030)
pH: 7 (ref 5.0–8.0)
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
## 2023-12-14 ENCOUNTER — Encounter: Admitting: Obstetrics and Gynecology

## 2023-12-16 ENCOUNTER — Encounter: Payer: Self-pay | Admitting: Obstetrics and Gynecology

## 2023-12-16 ENCOUNTER — Ambulatory Visit (INDEPENDENT_AMBULATORY_CARE_PROVIDER_SITE_OTHER): Admitting: Obstetrics and Gynecology

## 2023-12-16 VITALS — BP 112/71 | HR 81 | Wt 198.0 lb

## 2023-12-16 DIAGNOSIS — O0990 Supervision of high risk pregnancy, unspecified, unspecified trimester: Secondary | ICD-10-CM

## 2023-12-16 DIAGNOSIS — O34219 Maternal care for unspecified type scar from previous cesarean delivery: Secondary | ICD-10-CM | POA: Diagnosis not present

## 2023-12-16 DIAGNOSIS — Z8759 Personal history of other complications of pregnancy, childbirth and the puerperium: Secondary | ICD-10-CM

## 2023-12-16 DIAGNOSIS — O09893 Supervision of other high risk pregnancies, third trimester: Secondary | ICD-10-CM

## 2023-12-16 DIAGNOSIS — Z86718 Personal history of other venous thrombosis and embolism: Secondary | ICD-10-CM

## 2023-12-16 MED ORDER — HEPARIN SODIUM (PORCINE) 10000 UNIT/ML IJ SOLN: 10000.0000 [IU] | Freq: Two times a day (BID) | INTRAMUSCULAR | 1 refills | Status: DC

## 2023-12-16 NOTE — Patient Instructions (Signed)
 Stop your lovenox and start heparin

## 2023-12-16 NOTE — Progress Notes (Signed)
   PRENATAL VISIT NOTE Chief Complaint  Patient presents with   Routine Prenatal Visit    Subjective:  Marilyn Howard is a 28 y.o. G2P1001 at [redacted]w[redacted]d being seen today for ongoing prenatal care.  She is currently monitored for the following issues for this high-risk pregnancy and has Supervision of high risk pregnancy, antepartum; GAD (generalized anxiety disorder); History of DVT (deep vein thrombosis); Hypersomnia; Polyarthralgia; Vitamin D deficiency; History of placenta abruption; History of cesarean delivery; and Abnormal ultrasound on their problem list.  Patient reports no complaints.  Contractions: Irregular. Vag. Bleeding: None.  Movement: Present. Denies leaking of fluid.   The following portions of the patient's history were reviewed and updated as appropriate: allergies, current medications, past family history, past medical history, past social history, past surgical history and problem list.   Objective:   Vitals:   12/16/23 1345  BP: 112/71  Pulse: 81  Weight: 198 lb (89.8 kg)   Body mass index is 31.96 kg/m. Total weight gain: 33 lb (15 kg)   Fetal Status: Fetal Heart Rate (bpm): 141 Fundal Height: 38 cm Movement: Present  Presentation: Vertex  General:  Alert, oriented and cooperative. Patient is in no acute distress.  Skin: Skin is warm and dry. No rash noted.   Cardiovascular: Normal heart rate noted  Respiratory: Normal respiratory effort, no problems with respiration noted  Abdomen: Soft, gravid, appropriate for gestational age.  Pain/Pressure: Present     Pelvic: Cervical exam deferred        Extremities: Normal range of motion.  Edema: Trace  Mental Status: Normal mood and affect. Normal behavior. Normal judgment and thought content.   Assessment and Plan:  Pregnancy: G2P1001 at [redacted]w[redacted]d 1. Supervision of high risk pregnancy, antepartum (Primary) Doing well GBS next visit  2. Previous deep vein thrombosis (DVT) affecting pregnancy in third  trimester Transition to heparin. Stop lovenox. Reviewed holding heparin if signs/symptoms of labor. - heparin 16109 UNIT/ML injection; Inject 1 mL (10,000 Units total) into the skin every 12 (twelve) hours.  Dispense: 60 mL; Refill: 1  3. History of cesarean delivery, currently pregnant TOLAC planned. Consent signed 10/30/23  4. History of placenta abruption No signs/symptoms at this time   Preterm labor symptoms and general obstetric precautions including but not limited to vaginal bleeding, contractions, leaking of fluid and fetal movement were reviewed in detail with the patient. Please refer to After Visit Summary for other counseling recommendations.   Return in about 1 week (around 12/23/2023).  Future Appointments  Date Time Provider Department Center  12/21/2023  8:35 AM Warden Fillers, MD [facility] None  12/30/2023  4:10 PM Sue Lush, FNP [facility] None    Wanita Chamberlain, MD, FACOG Obstetrician & Gynecologist, [facility] for [facility], [facility]

## 2023-12-20 ENCOUNTER — Other Ambulatory Visit: Payer: Self-pay | Admitting: Obstetrics and Gynecology

## 2023-12-20 DIAGNOSIS — Z86718 Personal history of other venous thrombosis and embolism: Secondary | ICD-10-CM

## 2023-12-21 ENCOUNTER — Other Ambulatory Visit (HOSPITAL_COMMUNITY): Payer: Self-pay

## 2023-12-21 ENCOUNTER — Ambulatory Visit (INDEPENDENT_AMBULATORY_CARE_PROVIDER_SITE_OTHER): Admitting: Obstetrics and Gynecology

## 2023-12-21 ENCOUNTER — Other Ambulatory Visit (HOSPITAL_COMMUNITY)
Admission: RE | Admit: 2023-12-21 | Discharge: 2023-12-21 | Disposition: A | Discharge status: Home / Self Care | Source: Ambulatory Visit | Attending: Obstetrics and Gynecology | Admitting: Obstetrics and Gynecology

## 2023-12-21 ENCOUNTER — Other Ambulatory Visit: Payer: Self-pay | Admitting: *Deleted

## 2023-12-21 VITALS — BP 132/76 | HR 92 | Wt 199.7 lb

## 2023-12-21 DIAGNOSIS — Z8759 Personal history of other complications of pregnancy, childbirth and the puerperium: Secondary | ICD-10-CM

## 2023-12-21 DIAGNOSIS — Z98891 History of uterine scar from previous surgery: Secondary | ICD-10-CM

## 2023-12-21 DIAGNOSIS — O0990 Supervision of high risk pregnancy, unspecified, unspecified trimester: Secondary | ICD-10-CM | POA: Diagnosis not present

## 2023-12-21 DIAGNOSIS — Z86718 Personal history of other venous thrombosis and embolism: Secondary | ICD-10-CM

## 2023-12-21 DIAGNOSIS — Z3A36 36 weeks gestation of pregnancy: Secondary | ICD-10-CM

## 2023-12-21 DIAGNOSIS — Z3A00 Weeks of gestation of pregnancy not specified: Secondary | ICD-10-CM | POA: Insufficient documentation

## 2023-12-21 DIAGNOSIS — O09893 Supervision of other high risk pregnancies, third trimester: Secondary | ICD-10-CM

## 2023-12-21 MED ORDER — HEPARIN SODIUM (PORCINE) 10000 UNIT/ML IJ SOLN
10000.0000 [IU] | Freq: Two times a day (BID) | INTRAMUSCULAR | 1 refills | Status: DC
  Filled 2023-12-21: qty 148, 74d supply, fill #0
  Filled 2023-12-21 (×2): qty 32, 16d supply, fill #0
  Filled 2023-12-21: qty 28, 14d supply, fill #0

## 2023-12-21 NOTE — Progress Notes (Signed)
 RX Heparin transferred to [facility].

## 2023-12-21 NOTE — Progress Notes (Signed)
 Pt presents for rob. Pt has no questions or concerns at this time.

## 2023-12-21 NOTE — Progress Notes (Signed)
   PRENATAL VISIT NOTE  Subjective:  Marilyn Howard is a 28 y.o. G2P1001 at [redacted]w[redacted]d being seen today for ongoing prenatal care.  She is currently monitored for the following issues for this high-risk pregnancy and has Supervision of high risk pregnancy, antepartum; GAD (generalized anxiety disorder); History of DVT (deep vein thrombosis); Hypersomnia; Polyarthralgia; Vitamin D deficiency; History of placenta abruption; History of cesarean delivery; and Abnormal ultrasound on their problem list.  Patient doing well with no acute concerns today. She reports no complaints.  Contractions: Irritability. Vag. Bleeding: None.  Movement: Present. Denies leaking of fluid.   The following portions of the patient's history were reviewed and updated as appropriate: allergies, current medications, past family history, past medical history, past social history, past surgical history and problem list. Problem list updated.  Objective:   Vitals:   12/21/23 0832  BP: 132/76  Pulse: 92  Weight: 199 lb 11.2 oz (90.6 kg)    Fetal Status: Fetal Heart Rate (bpm): 153 Fundal Height: 38 cm Movement: Present     General:  Alert, oriented and cooperative. Patient is in no acute distress.  Skin: Skin is warm and dry. No rash noted.   Cardiovascular: Normal heart rate noted  Respiratory: Normal respiratory effort, no problems with respiration noted  Abdomen: Soft, gravid, appropriate for gestational age.  Pain/Pressure: Present     Pelvic: Cervical exam deferred Dilation: Fingertip Effacement (%): 60 Station: -3  Extremities: Normal range of motion.  Edema: Trace  Mental Status:  Normal mood and affect. Normal behavior. Normal judgment and thought content.   Assessment and Plan:  Pregnancy: G2P1001 at [redacted]w[redacted]d  1. [redacted] weeks gestation of pregnancy (Primary)   2. History of placenta abruption No report of abruption during this pregnancy  3. Supervision of high risk pregnancy, antepartum Continue routine prenatal  care  - Cervicovaginal ancillary only( [place]) - Culture, beta strep (group b only)  4. History of DVT (deep vein thrombosis) Pt was converted the previous visit to heparin.  She has not been able to get heparin so she has continued to take lovenox.  Pt advised to hold lovenox if her water breaks or she thinks she may be going into labor  5. History of cesarean delivery Pt desires TOLAC, consent on chart  Preterm labor symptoms and general obstetric precautions including but not limited to vaginal bleeding, contractions, leaking of fluid and fetal movement were reviewed in detail with the patient.  Please refer to After Visit Summary for other counseling recommendations.   Return in about 1 week (around 12/28/2023) for [facility], in person.   Avie Boeck, MD Faculty Attending Center for [facility]

## 2023-12-23 LAB — CERVICOVAGINAL ANCILLARY ONLY
Bacterial Vaginitis (gardnerella): NEGATIVE
Candida Glabrata: NEGATIVE
Candida Vaginitis: NEGATIVE
Chlamydia: NEGATIVE
Comment: NEGATIVE
Comment: NEGATIVE
Comment: NEGATIVE
Comment: NEGATIVE
Comment: NEGATIVE
Comment: NORMAL
Neisseria Gonorrhea: NEGATIVE
Trichomonas: NEGATIVE

## 2023-12-24 LAB — CULTURE, BETA STREP (GROUP B ONLY): Strep Gp B Culture: POSITIVE — AB

## 2023-12-28 ENCOUNTER — Encounter: Admitting: Obstetrics

## 2023-12-30 ENCOUNTER — Ambulatory Visit (INDEPENDENT_AMBULATORY_CARE_PROVIDER_SITE_OTHER): Admitting: Obstetrics and Gynecology

## 2023-12-30 ENCOUNTER — Encounter: Payer: Self-pay | Admitting: Obstetrics and Gynecology

## 2023-12-30 VITALS — BP 112/74 | HR 78 | Wt 201.0 lb

## 2023-12-30 DIAGNOSIS — Z8759 Personal history of other complications of pregnancy, childbirth and the puerperium: Secondary | ICD-10-CM

## 2023-12-30 DIAGNOSIS — Z86718 Personal history of other venous thrombosis and embolism: Secondary | ICD-10-CM

## 2023-12-30 DIAGNOSIS — O0990 Supervision of high risk pregnancy, unspecified, unspecified trimester: Secondary | ICD-10-CM | POA: Diagnosis not present

## 2023-12-30 DIAGNOSIS — Z3A37 37 weeks gestation of pregnancy: Secondary | ICD-10-CM

## 2023-12-30 DIAGNOSIS — Z98891 History of uterine scar from previous surgery: Secondary | ICD-10-CM

## 2023-12-30 NOTE — Progress Notes (Signed)
   PRENATAL VISIT NOTE  Subjective:  Marilyn Howard is a 28 y.o. G2P1001 at [redacted]w[redacted]d being seen today for ongoing prenatal care.  She is currently monitored for the following issues for this high-risk pregnancy and has Supervision of high risk pregnancy, antepartum; GAD (generalized anxiety disorder); History of DVT (deep vein thrombosis); Hypersomnia; Polyarthralgia; Vitamin D deficiency; History of placenta abruption; History of cesarean delivery; and Abnormal ultrasound on their problem list.  Patient reports  increase in discharge, no irritation, itching .  Contractions: Irritability. Vag. Bleeding: None.  Movement: Present. Denies leaking of fluid.   The following portions of the patient's history were reviewed and updated as appropriate: allergies, current medications, past family history, past medical history, past social history, past surgical history and problem list.   Objective:   Vitals:   12/30/23 1637  BP: 112/74  Pulse: 78  Weight: 201 lb (91.2 kg)    Fetal Status: Fetal Heart Rate (bpm): 140 Fundal Height: 37 cm Movement: Present     General:  Alert, oriented and cooperative. Patient is in no acute distress.  Skin: Skin is warm and dry. No rash noted.   Cardiovascular: Normal heart rate noted  Respiratory: Normal respiratory effort, no problems with respiration noted  Abdomen: Soft, gravid, appropriate for gestational age.  Pain/Pressure: Present     Pelvic: Cervical exam deferred        Extremities: Normal range of motion.  Edema: Trace  Mental Status: Normal mood and affect. Normal behavior. Normal judgment and thought content.   Assessment and Plan:  Pregnancy: G2P1001 at [redacted]w[redacted]d 1. Supervision of high risk pregnancy, antepartum (Primary) BP and FHR normal Doing well, feeling regular movement  FH appropriate   2. History of DVT (deep vein thrombosis) Pharmacy has been unable to fill heparin , received call it is ready, will pick up today  3. History of placenta  abruption   4. History of cesarean delivery Desires tolac, consent previously signed    5. [redacted] weeks gestation of pregnancy Labor precautions discussed as well as vaginal d/c   Term labor symptoms and general obstetric precautions including but not limited to vaginal bleeding, contractions, leaking of fluid and fetal movement were reviewed in detail with the patient. Please refer to After Visit Summary for other counseling recommendations.   Return in about 1 week (around 01/06/2024) for OB VISIT (MD ONLY).  Future Appointments  Date Time Provider Department Center  01/06/2024  4:10 PM Abigail Abler, MD [facility] None    Susi Eric, FNP

## 2023-12-30 NOTE — Progress Notes (Signed)
 Pt presents for ROB visit. Pt c/o increased discharge. Pt will pick up Heparin  today.

## 2024-01-04 ENCOUNTER — Telehealth: Payer: Self-pay | Admitting: *Deleted

## 2024-01-04 NOTE — Telephone Encounter (Signed)
 TC to f/u on after hours nurse call. Pt reports intermittent back and abdominal pain and increase in vaginal discharge. Reports UC's that start up and then go away, but has constant cramping and low pelvic pressure. Reports good FM. Reports a constant feeling of wetness in underwear. Pt advised to seek care in Maternity Assessment Unit for labor and ROM eval. Pt verbalized understanding.

## 2024-01-06 ENCOUNTER — Ambulatory Visit: Admitting: Obstetrics and Gynecology

## 2024-01-06 VITALS — BP 112/70 | HR 89 | Wt 201.0 lb

## 2024-01-06 DIAGNOSIS — Z98891 History of uterine scar from previous surgery: Secondary | ICD-10-CM | POA: Diagnosis not present

## 2024-01-06 DIAGNOSIS — O0990 Supervision of high risk pregnancy, unspecified, unspecified trimester: Secondary | ICD-10-CM | POA: Diagnosis not present

## 2024-01-06 DIAGNOSIS — Z8759 Personal history of other complications of pregnancy, childbirth and the puerperium: Secondary | ICD-10-CM | POA: Diagnosis not present

## 2024-01-06 DIAGNOSIS — Z86718 Personal history of other venous thrombosis and embolism: Secondary | ICD-10-CM

## 2024-01-06 DIAGNOSIS — Z3A38 38 weeks gestation of pregnancy: Secondary | ICD-10-CM

## 2024-01-06 NOTE — Progress Notes (Signed)
   PRENATAL VISIT NOTE  Subjective:  Marilyn Howard is a 28 y.o. G2P1001 at [redacted]w[redacted]d being seen today for ongoing prenatal care.  She is currently monitored for the following issues for this high-risk pregnancy and has Supervision of high risk pregnancy, antepartum; GAD (generalized anxiety disorder); History of DVT (deep vein thrombosis); Hypersomnia; Polyarthralgia; Vitamin D deficiency; History of placenta abruption; History of cesarean delivery; and Abnormal ultrasound on their problem list.  Patient reports  had contractions this weekend that were consistent and then they stopped  .  Contractions: Irregular. Vag. Bleeding: None.  Movement: Present. Denies leaking of fluid.   The following portions of the patient's history were reviewed and updated as appropriate: allergies, current medications, past family history, past medical history, past social history, past surgical history and problem list.   Objective:   Vitals:   01/06/24 1620  BP: 112/70  Pulse: 89  Weight: 201 lb (91.2 kg)    Fetal Status:   Fundal Height: 38 cm Movement: Present     General:  Alert, oriented and cooperative. Patient is in no acute distress.  Skin: Skin is warm and dry. No rash noted.   Cardiovascular: Normal heart rate noted  Respiratory: Normal respiratory effort, no problems with respiration noted  Abdomen: Soft, gravid, appropriate for gestational age.  Pain/Pressure: Present     Pelvic: Cervical exam performed in the presence of a chaperone Dilation: Fingertip Effacement (%): 50    Extremities: Normal range of motion.     Mental Status: Normal mood and affect. Normal behavior. Normal judgment and thought content.   Assessment and Plan:  Pregnancy: G2P1001 at [redacted]w[redacted]d 1. Supervision of high risk pregnancy, antepartum (Primary) BP and FHR normal Doing well, feeling regular movement    2. History of DVT (deep vein thrombosis) Switched to heparin  last week   3. History of placenta abruption   4.  History of cesarean delivery Desires TOLAC, consent signed previously     5. [redacted] weeks gestation of pregnancy Labor precautions discussed as well as labor readiness methods   Preterm labor symptoms and general obstetric precautions including but not limited to vaginal bleeding, contractions, leaking of fluid and fetal movement were reviewed in detail with the patient. Please refer to After Visit Summary for other counseling recommendations.  Future Appointments  Date Time Provider Department Center  01/13/2024  4:10 PM Leftwich-Kirby, Darren Em, CNM [facility] None     Susi Eric, FNP

## 2024-01-08 ENCOUNTER — Encounter: Payer: Self-pay | Admitting: Advanced Practice Midwife

## 2024-01-11 ENCOUNTER — Ambulatory Visit: Admitting: Obstetrics and Gynecology

## 2024-01-11 ENCOUNTER — Encounter: Payer: Self-pay | Admitting: Obstetrics and Gynecology

## 2024-01-11 VITALS — BP 109/76 | HR 79 | Wt 202.0 lb

## 2024-01-11 DIAGNOSIS — O0993 Supervision of high risk pregnancy, unspecified, third trimester: Secondary | ICD-10-CM

## 2024-01-11 DIAGNOSIS — O0990 Supervision of high risk pregnancy, unspecified, unspecified trimester: Secondary | ICD-10-CM

## 2024-01-11 DIAGNOSIS — Z1339 Encounter for screening examination for other mental health and behavioral disorders: Secondary | ICD-10-CM | POA: Diagnosis not present

## 2024-01-11 DIAGNOSIS — Z3A39 39 weeks gestation of pregnancy: Secondary | ICD-10-CM

## 2024-01-11 DIAGNOSIS — Z98891 History of uterine scar from previous surgery: Secondary | ICD-10-CM | POA: Diagnosis not present

## 2024-01-11 DIAGNOSIS — Z86718 Personal history of other venous thrombosis and embolism: Secondary | ICD-10-CM | POA: Diagnosis not present

## 2024-01-11 NOTE — Progress Notes (Signed)
 ROB, Pt wants her membrane stripped today.

## 2024-01-11 NOTE — Progress Notes (Signed)
   PRENATAL VISIT NOTE  Subjective:  Marilyn Howard is a 28 y.o. G2P1001 at [redacted]w[redacted]d being seen today for ongoing prenatal care.  She is currently monitored for the following issues for this high-risk pregnancy and has Supervision of high risk pregnancy, antepartum; GAD (generalized anxiety disorder); History of DVT (deep vein thrombosis); Hypersomnia; Polyarthralgia; Vitamin D deficiency; History of placenta abruption; History of cesarean delivery; and Abnormal ultrasound on their problem list.  Patient reports no complaints.  Contractions: Irritability. Vag. Bleeding: None.  Movement: Present. Denies leaking of fluid.   The following portions of the patient's history were reviewed and updated as appropriate: allergies, current medications, past family history, past medical history, past social history, past surgical history and problem list.   Objective:   Vitals:   01/11/24 1003  BP: 109/76  Pulse: 79  Weight: 202 lb (91.6 kg)    Fetal Status: Fetal Heart Rate (bpm): 138 Fundal Height: 39 cm Movement: Present     General:  Alert, oriented and cooperative. Patient is in no acute distress.  Skin: Skin is warm and dry. No rash noted.   Cardiovascular: Normal heart rate noted  Respiratory: Normal respiratory effort, no problems with respiration noted  Abdomen: Soft, gravid, appropriate for gestational age.  Pain/Pressure: Present     Pelvic: Cervical exam performed in the presence of a chaperone Dilation: Fingertip Effacement (%): 50    Extremities: Normal range of motion.  Edema: None  Mental Status: Normal mood and affect. Normal behavior. Normal judgment and thought content.   Assessment and Plan:  Pregnancy: G2P1001 at [redacted]w[redacted]d 1. Supervision of high risk pregnancy, antepartum (Primary) Patient is doing well without complaints  2. History of DVT (deep vein thrombosis) Currently on heparin  Will plan for IOL at 40 weeks  3. History of cesarean delivery Patient desires TOLAC  Term  labor symptoms and general obstetric precautions including but not limited to vaginal bleeding, contractions, leaking of fluid and fetal movement were reviewed in detail with the patient. Please refer to After Visit Summary for other counseling recommendations.   Return in about 6 weeks (around 02/22/2024) for postpartum.  Future Appointments  Date Time Provider Department Center  01/16/2024  7:00 AM [facility] SCHED ROOM [facility] None    Verlyn Goad, MD

## 2024-01-13 ENCOUNTER — Encounter: Admitting: Advanced Practice Midwife

## 2024-01-16 ENCOUNTER — Inpatient Hospital Stay (HOSPITAL_COMMUNITY)

## 2024-01-16 ENCOUNTER — Inpatient Hospital Stay (HOSPITAL_COMMUNITY): Admitting: Anesthesiology

## 2024-01-16 ENCOUNTER — Encounter (HOSPITAL_COMMUNITY): Payer: Self-pay | Admitting: Obstetrics and Gynecology

## 2024-01-16 ENCOUNTER — Inpatient Hospital Stay (HOSPITAL_COMMUNITY)
Admission: RE | Admit: 2024-01-16 | Discharge: 2024-01-18 | DRG: 807 | Disposition: A | Discharge status: Home / Self Care | Attending: Obstetrics & Gynecology | Admitting: Obstetrics & Gynecology

## 2024-01-16 ENCOUNTER — Other Ambulatory Visit: Payer: Self-pay

## 2024-01-16 DIAGNOSIS — O480 Post-term pregnancy: Secondary | ICD-10-CM | POA: Diagnosis not present

## 2024-01-16 DIAGNOSIS — O34219 Maternal care for unspecified type scar from previous cesarean delivery: Secondary | ICD-10-CM | POA: Diagnosis present

## 2024-01-16 DIAGNOSIS — Z87891 Personal history of nicotine dependence: Secondary | ICD-10-CM | POA: Diagnosis not present

## 2024-01-16 DIAGNOSIS — O99820 Streptococcus B carrier state complicating pregnancy: Secondary | ICD-10-CM | POA: Diagnosis not present

## 2024-01-16 DIAGNOSIS — Z3A40 40 weeks gestation of pregnancy: Secondary | ICD-10-CM | POA: Diagnosis not present

## 2024-01-16 DIAGNOSIS — O99824 Streptococcus B carrier state complicating childbirth: Secondary | ICD-10-CM | POA: Diagnosis present

## 2024-01-16 DIAGNOSIS — O26893 Other specified pregnancy related conditions, third trimester: Secondary | ICD-10-CM | POA: Diagnosis present

## 2024-01-16 DIAGNOSIS — Z86718 Personal history of other venous thrombosis and embolism: Secondary | ICD-10-CM | POA: Diagnosis not present

## 2024-01-16 DIAGNOSIS — O34211 Maternal care for low transverse scar from previous cesarean delivery: Secondary | ICD-10-CM | POA: Diagnosis not present

## 2024-01-16 DIAGNOSIS — O99344 Other mental disorders complicating childbirth: Secondary | ICD-10-CM | POA: Diagnosis not present

## 2024-01-16 LAB — CBC
HCT: 34.9 % — ABNORMAL LOW (ref 36.0–46.0)
Hemoglobin: 11.4 g/dL — ABNORMAL LOW (ref 12.0–15.0)
MCH: 28.4 pg (ref 26.0–34.0)
MCHC: 32.7 g/dL (ref 30.0–36.0)
MCV: 86.8 fL (ref 80.0–100.0)
Platelets: 218 10*3/uL (ref 150–400)
RBC: 4.02 MIL/uL (ref 3.87–5.11)
RDW: 13.4 % (ref 11.5–15.5)
WBC: 11.6 10*3/uL — ABNORMAL HIGH (ref 4.0–10.5)
nRBC: 0 % (ref 0.0–0.2)

## 2024-01-16 LAB — TYPE AND SCREEN
ABO/RH(D): O POS
Antibody Screen: NEGATIVE

## 2024-01-16 LAB — RPR: RPR Ser Ql: NONREACTIVE

## 2024-01-16 MED ORDER — DIPHENHYDRAMINE HCL 50 MG/ML IJ SOLN: 12.5000 mg | INTRAMUSCULAR | Status: DC | PRN

## 2024-01-16 MED ORDER — SOD CITRATE-CITRIC ACID 500-334 MG/5ML PO SOLN: 30.0000 mL | ORAL | Status: DC | PRN

## 2024-01-16 MED ORDER — FENTANYL-BUPIVACAINE-NACL 0.5-0.125-0.9 MG/250ML-% EP SOLN
12.0000 mL/h | EPIDURAL | Status: DC | PRN
  Administered 2024-01-16: 12 mL/h via EPIDURAL
  Filled 2024-01-16: qty 250

## 2024-01-16 MED ORDER — OXYTOCIN-SODIUM CHLORIDE 30-0.9 UT/500ML-% IV SOLN
1.0000 m[IU]/min | INTRAVENOUS | Status: DC
  Administered 2024-01-16: 2 m[IU]/min via INTRAVENOUS
  Filled 2024-01-16: qty 500

## 2024-01-16 MED ORDER — LACTATED RINGERS IV SOLN: 500.0000 mL | INTRAVENOUS | Status: DC | PRN

## 2024-01-16 MED ORDER — FENTANYL CITRATE (PF) 100 MCG/2ML IJ SOLN
INTRAMUSCULAR | Status: DC | PRN
  Administered 2024-01-16: 100 ug via EPIDURAL

## 2024-01-16 MED ORDER — LACTATED RINGERS IV SOLN: 500.0000 mL | Freq: Once | INTRAVENOUS | Status: DC

## 2024-01-16 MED ORDER — FENTANYL-BUPIVACAINE-NACL 0.5-0.125-0.9 MG/250ML-% EP SOLN: 12.0000 mL/h | EPIDURAL | Status: DC | PRN

## 2024-01-16 MED ORDER — PHENYLEPHRINE 80 MCG/ML (10ML) SYRINGE FOR IV PUSH (FOR BLOOD PRESSURE SUPPORT): 80.0000 ug | PREFILLED_SYRINGE | INTRAVENOUS | Status: DC | PRN

## 2024-01-16 MED ORDER — PHENYLEPHRINE 80 MCG/ML (10ML) SYRINGE FOR IV PUSH (FOR BLOOD PRESSURE SUPPORT)
80.0000 ug | PREFILLED_SYRINGE | INTRAVENOUS | Status: DC | PRN
  Filled 2024-01-16: qty 10

## 2024-01-16 MED ORDER — SODIUM CHLORIDE 0.9 % IV SOLN
5.0000 10*6.[IU] | Freq: Once | INTRAVENOUS | Status: AC
  Administered 2024-01-16: 5 10*6.[IU] via INTRAVENOUS
  Filled 2024-01-16: qty 5

## 2024-01-16 MED ORDER — ACETAMINOPHEN 325 MG PO TABS: 650.0000 mg | ORAL_TABLET | ORAL | Status: DC | PRN

## 2024-01-16 MED ORDER — LACTATED RINGERS IV SOLN
500.0000 mL | Freq: Once | INTRAVENOUS | Status: DC
Start: 2024-01-16 — End: 2024-01-16

## 2024-01-16 MED ORDER — OXYCODONE-ACETAMINOPHEN 5-325 MG PO TABS: 2.0000 | ORAL_TABLET | ORAL | Status: DC | PRN

## 2024-01-16 MED ORDER — EPHEDRINE 5 MG/ML INJ: 10.0000 mg | INTRAVENOUS | Status: DC | PRN

## 2024-01-16 MED ORDER — OXYCODONE-ACETAMINOPHEN 5-325 MG PO TABS: 1.0000 | ORAL_TABLET | ORAL | Status: DC | PRN

## 2024-01-16 MED ORDER — PENICILLIN G POT IN DEXTROSE 60000 UNIT/ML IV SOLN
3.0000 10*6.[IU] | INTRAVENOUS | Status: DC
  Administered 2024-01-16 (×3): 3 10*6.[IU] via INTRAVENOUS
  Filled 2024-01-16 (×5): qty 50

## 2024-01-16 MED ORDER — TERBUTALINE SULFATE 1 MG/ML IJ SOLN: 0.2500 mg | Freq: Once | INTRAMUSCULAR | Status: DC | PRN

## 2024-01-16 MED ORDER — FENTANYL CITRATE (PF) 100 MCG/2ML IJ SOLN
50.0000 ug | INTRAMUSCULAR | Status: DC | PRN
  Administered 2024-01-16: 100 ug via INTRAVENOUS
  Administered 2024-01-16 (×3): 50 ug via INTRAVENOUS
  Filled 2024-01-16 (×5): qty 2

## 2024-01-16 MED ORDER — OXYTOCIN-SODIUM CHLORIDE 30-0.9 UT/500ML-% IV SOLN: 2.5000 [IU]/h | INTRAVENOUS | Status: DC

## 2024-01-16 MED ORDER — ONDANSETRON HCL 4 MG/2ML IJ SOLN: 4.0000 mg | Freq: Four times a day (QID) | INTRAMUSCULAR | Status: DC | PRN

## 2024-01-16 MED ORDER — LIDOCAINE HCL (PF) 1 % IJ SOLN
30.0000 mL | INTRAMUSCULAR | Status: DC | PRN
  Filled 2024-01-16: qty 30

## 2024-01-16 MED ORDER — OXYTOCIN BOLUS FROM INFUSION: 333.0000 mL | Freq: Once | INTRAVENOUS | Status: DC

## 2024-01-16 MED ORDER — LIDOCAINE HCL (PF) 1 % IJ SOLN
INTRAMUSCULAR | Status: DC | PRN
  Administered 2024-01-16: 8 mL via EPIDURAL

## 2024-01-16 MED ORDER — LACTATED RINGERS IV SOLN: INTRAVENOUS | Status: DC

## 2024-01-16 NOTE — Anesthesia Preprocedure Evaluation (Signed)
 Anesthesia Evaluation  Patient identified by MRN, date of birth, ID band Patient awake    Reviewed: Allergy & Precautions, H&P , NPO status , Patient's Chart, lab work & pertinent test results, reviewed documented beta blocker date and time   Airway Mallampati: II  TM Distance: >3 FB Neck ROM: full    Dental no notable dental hx.    Pulmonary neg pulmonary ROS, former smoker   Pulmonary exam normal breath sounds clear to auscultation       Cardiovascular negative cardio ROS Normal cardiovascular exam+ Valvular Problems/Murmurs  Rhythm:regular Rate:Normal     Neuro/Psych  PSYCHIATRIC DISORDERS Anxiety     negative neurological ROS  negative psych ROS   GI/Hepatic negative GI ROS, Neg liver ROS,,,  Endo/Other  negative endocrine ROS    Renal/GU negative Renal ROS  negative genitourinary   Musculoskeletal   Abdominal   Peds  Hematology negative hematology ROS (+) Blood dyscrasia   Anesthesia Other Findings   Reproductive/Obstetrics (+) Pregnancy                             Anesthesia Physical Anesthesia Plan  ASA: 2  Anesthesia Plan: Epidural   Post-op Pain Management: Minimal or no pain anticipated   Induction: Intravenous  PONV Risk Score and Plan: 2 and Ondansetron  Airway Management Planned: Natural Airway  Additional Equipment: Fetal Monitoring  Intra-op Plan:   Post-operative Plan:   Informed Consent: I have reviewed the patients History and Physical, chart, labs and discussed the procedure including the risks, benefits and alternatives for the proposed anesthesia with the patient or authorized representative who has indicated his/her understanding and acceptance.       Plan Discussed with: Anesthesiologist and CRNA  Anesthesia Plan Comments:        Anesthesia Quick Evaluation
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
## 2024-01-16 NOTE — Progress Notes (Signed)
 Labor Progress Note Marilyn Howard is a 28 y.o. G2P1001 at [redacted]w[redacted]d presented for IOL due to hx DVT  In to re-eval pt. Pt feeling significantly more pressure during and between contractions. Some difficulty tracing FHR, but overall reassuring. Cx now complete, 0 station. Pt pushing with good effort. Anticipate VBAC soon.  Melanie Spires, MD 11:01 PM

## 2024-01-16 NOTE — Progress Notes (Addendum)
 LABOR PROGRESS NOTE  Patient Name: Marilyn Howard, female   DOB: 1995/10/02, 28 y.o.  MRN: [id number]  Notified by nursing while in OR that [name], CVE 4/70/-3 baby not well applied for AROM, verbal order for 2x2 pitocin. Plan to AROM at next check.   [name], MD

## 2024-01-16 NOTE — Anesthesia Procedure Notes (Signed)
 Epidural Patient location during procedure: OB Start time: 01/16/2024 9:40 PM End time: 01/16/2024 9:48 PM  Staffing Anesthesiologist: Rhenda Cedars, MD  Preanesthetic Checklist Completed: patient identified, IV checked, site marked, risks and benefits discussed, surgical consent, monitors and equipment checked, pre-op evaluation and timeout performed  Epidural Patient position: sitting Prep: DuraPrep and site prepped and draped Patient monitoring: continuous pulse ox and blood pressure Approach: midline Location: L4-L5 Injection technique: LOR air  Needle:  Needle type: Tuohy  Needle gauge: 17 G Needle length: 9 cm and 9 Needle insertion depth: 6 cm Catheter type: closed end flexible Catheter size: 19 Gauge Catheter at skin depth: 12 cm Test dose: negative  Assessment Events: blood not aspirated, no cerebrospinal fluid, injection not painful, no injection resistance, no paresthesia and negative IV test

## 2024-01-16 NOTE — Progress Notes (Signed)
 LABOR PROGRESS NOTE  Patient Name: Marilyn Howard, female   DOB: November 23, 1995, 28 y.o.  MRN: [id number]  Cat 1 strip, CVE unchanged with baby not well applied for AROM even on my check.  Will continue with Pit titration with hopeful AROM at next check.   [name], MD
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
## 2024-01-17 ENCOUNTER — Encounter (HOSPITAL_COMMUNITY): Payer: Self-pay | Admitting: Obstetrics and Gynecology

## 2024-01-17 DIAGNOSIS — O99344 Other mental disorders complicating childbirth: Secondary | ICD-10-CM | POA: Diagnosis not present

## 2024-01-17 DIAGNOSIS — O99820 Streptococcus B carrier state complicating pregnancy: Secondary | ICD-10-CM | POA: Diagnosis not present

## 2024-01-17 DIAGNOSIS — O480 Post-term pregnancy: Secondary | ICD-10-CM | POA: Diagnosis not present

## 2024-01-17 DIAGNOSIS — O34211 Maternal care for low transverse scar from previous cesarean delivery: Secondary | ICD-10-CM | POA: Diagnosis not present

## 2024-01-17 DIAGNOSIS — Z3A40 40 weeks gestation of pregnancy: Secondary | ICD-10-CM

## 2024-01-17 LAB — CREATININE, SERUM
Creatinine, Ser: 0.6 mg/dL (ref 0.44–1.00)
GFR, Estimated: >60 mL/min (ref >60)

## 2024-01-17 MED ORDER — COCONUT OIL OIL
1.0000 | TOPICAL_OIL | Status: DC | PRN
  Administered 2024-01-17: 1 via TOPICAL

## 2024-01-17 MED ORDER — DIBUCAINE (PERIANAL) 1 % EX OINT: 1.0000 | TOPICAL_OINTMENT | CUTANEOUS | Status: DC | PRN

## 2024-01-17 MED ORDER — ACETAMINOPHEN 325 MG PO TABS
650.0000 mg | ORAL_TABLET | ORAL | Status: DC | PRN
  Administered 2024-01-17 – 2024-01-18 (×3): 650 mg via ORAL
  Filled 2024-01-17 (×3): qty 2

## 2024-01-17 MED ORDER — SENNOSIDES-DOCUSATE SODIUM 8.6-50 MG PO TABS: 2.0000 | ORAL_TABLET | Freq: Every day | ORAL | Status: DC

## 2024-01-17 MED ORDER — PRENATAL MULTIVITAMIN CH
1.0000 | ORAL_TABLET | Freq: Every day | ORAL | Status: DC
  Administered 2024-01-17: 1 via ORAL
  Filled 2024-01-17: qty 1

## 2024-01-17 MED ORDER — TETANUS-DIPHTH-ACELL PERTUSSIS 5-2.5-18.5 LF-MCG/0.5 IM SUSY: 0.5000 mL | PREFILLED_SYRINGE | Freq: Once | INTRAMUSCULAR | Status: DC

## 2024-01-17 MED ORDER — ENOXAPARIN SODIUM 40 MG/0.4ML IJ SOSY
40.0000 mg | PREFILLED_SYRINGE | INTRAMUSCULAR | Status: DC
Start: 2024-01-17 — End: 2024-01-17

## 2024-01-17 MED ORDER — DIPHENHYDRAMINE HCL 25 MG PO CAPS: 25.0000 mg | ORAL_CAPSULE | Freq: Four times a day (QID) | ORAL | Status: DC | PRN

## 2024-01-17 MED ORDER — ONDANSETRON HCL 4 MG PO TABS: 4.0000 mg | ORAL_TABLET | ORAL | Status: DC | PRN

## 2024-01-17 MED ORDER — WITCH HAZEL-GLYCERIN EX PADS
1.0000 | MEDICATED_PAD | CUTANEOUS | Status: DC | PRN
  Administered 2024-01-18: 1 via TOPICAL

## 2024-01-17 MED ORDER — BENZOCAINE-MENTHOL 20-0.5 % EX AERO
1.0000 | INHALATION_SPRAY | CUTANEOUS | Status: DC | PRN
  Administered 2024-01-18: 1 via TOPICAL
  Filled 2024-01-17: qty 56

## 2024-01-17 MED ORDER — ZOLPIDEM TARTRATE 5 MG PO TABS: 5.0000 mg | ORAL_TABLET | Freq: Every evening | ORAL | Status: DC | PRN

## 2024-01-17 MED ORDER — CALCIUM CARBONATE ANTACID 500 MG PO CHEW: 1.0000 | CHEWABLE_TABLET | Freq: Every day | ORAL | Status: DC

## 2024-01-17 MED ORDER — TRANEXAMIC ACID-NACL 1000-0.7 MG/100ML-% IV SOLN
INTRAVENOUS | Status: AC
  Filled 2024-01-17: qty 100

## 2024-01-17 MED ORDER — ONDANSETRON HCL 4 MG/2ML IJ SOLN: 4.0000 mg | INTRAMUSCULAR | Status: DC | PRN

## 2024-01-17 MED ORDER — CALCIUM CARBONATE ANTACID 500 MG PO CHEW
1.0000 | CHEWABLE_TABLET | Freq: Every day | ORAL | Status: DC
  Administered 2024-01-17: 200 mg via ORAL
  Filled 2024-01-17: qty 1

## 2024-01-17 MED ORDER — SIMETHICONE 80 MG PO CHEW: 80.0000 mg | CHEWABLE_TABLET | ORAL | Status: DC | PRN

## 2024-01-17 MED ORDER — IBUPROFEN 600 MG PO TABS
600.0000 mg | ORAL_TABLET | Freq: Four times a day (QID) | ORAL | Status: DC
  Administered 2024-01-17 – 2024-01-18 (×5): 600 mg via ORAL
  Filled 2024-01-17 (×5): qty 1

## 2024-01-17 MED ORDER — TRANEXAMIC ACID-NACL 1000-0.7 MG/100ML-% IV SOLN: 1000.0000 mg | INTRAVENOUS | Status: DC

## 2024-01-17 MED ORDER — ENOXAPARIN SODIUM 40 MG/0.4ML IJ SOSY
40.0000 mg | PREFILLED_SYRINGE | Freq: Every day | INTRAMUSCULAR | Status: DC
  Administered 2024-01-17: 40 mg via SUBCUTANEOUS
  Filled 2024-01-17: qty 0.4

## 2024-01-17 NOTE — Anesthesia Postprocedure Evaluation (Signed)
 Anesthesia Post Note  Patient: Kyiesha N Beaudin  Procedure(s) Performed: AN AD HOC LABOR EPIDURAL     Patient location during evaluation: Mother Baby Anesthesia Type: Epidural Level of consciousness: awake and alert Pain management: pain level controlled Vital Signs Assessment: post-procedure vital signs reviewed and stable Respiratory status: spontaneous breathing, nonlabored ventilation and respiratory function stable Cardiovascular status: stable Postop Assessment: no headache, no backache and epidural receding Anesthetic complications: no   No notable events documented.  Last Vitals:  Vitals:   01/17/24 0253 01/17/24 0427  BP: (!) 115/59 (!) 93/53  Pulse: 89 83  Resp: 18 18  Temp: 36.8 C 37 C  SpO2: 99% 99%    Last Pain:  Vitals:   01/17/24 0427  TempSrc: Oral  PainSc:    Pain Goal:                   Xylon Croom

## 2024-01-17 NOTE — Progress Notes (Signed)
 Labor Progress Note Marilyn Howard is a 28 y.o. G2P1001 at [redacted]w[redacted]d presented for IOL due to hx DVT  Patient is pushing well, started again around 12 midnight. Category 1 FHR tracing Complete cervical dilatation, around +2 station Hopeful for VBAC soon.    Lenoard Rad, MD 12:22 AM

## 2024-01-17 NOTE — Plan of Care (Signed)

## 2024-01-17 NOTE — Discharge Summary (Signed)
 Postpartum Discharge Summary  Date of Service updated***     Patient Name: Marilyn Howard DOB: February 10, 1996 MRN: [id number]  Date of admission: 01/16/2024 Delivery date:01/17/2024 Delivering provider: Melanie Spires Date of discharge: 01/17/2024  Admitting diagnosis: Encounter for induction of labor [Z34.90] Intrauterine pregnancy: [redacted]w[redacted]d     Secondary diagnosis:  Active Problems:   * No active hospital problems. *  Additional problems:  -hx of cesarean delivery -abnormal ultrasound -hx of placental abruption -Polyarthralgia -GAD -Hx of DVT  -Vitamin D deficiency -hypersomnia    Discharge diagnosis: Term Pregnancy Delivered and hx DVT, VBAC                                              Post partum procedures:{Postpartum procedures:23558} Augmentation: Pitocin and IP Foley Complications: {OB Labor/Delivery Complications:20784}  Hospital course: Induction of Labor With Vaginal Delivery   28 y.o. yo G2P1001 at 103w1d was admitted to the hospital 01/16/2024 for induction of labor.  Indication for induction: TOLAC.  Patient had an uncomplicated labor course.  Membrane Rupture Time/Date: 7:27 PM,01/16/2024  Delivery Method:VBAC, Spontaneous Operative Delivery:N/A Episiotomy: None Lacerations:  2nd degree;Perineal Details of delivery can be found in separate delivery note.  Patient had a postpartum course complicated by***. Patient is discharged home 01/17/24.  Newborn Data: Birth date:01/17/2024 Birth time:12:46 AM Gender:Female Living status:Living Apgars:9 ,9  Weight:   Magnesium Sulfate received: {Mag received:30440022} BMZ received: No Rhophylac:N/A MMR:N/A T-DaP:*** Flu: No RSV Vaccine received: No Transfusion:{Transfusion received:30440034}  Immunizations received:  There is no immunization history on file for this patient.  Physical exam  Vitals:   01/16/24 2152 01/16/24 2155 01/16/24 2201 01/16/24 2205  BP: (!) 144/83 123/69 124/67 123/75  Pulse: (!) 133  92 86 91  Resp:      Temp:      TempSrc:      SpO2:  98% 100% 97%  Weight:      Height:       General: {Exam; general:21111117} Lochia: {Desc; appropriate/inappropriate:30686::"appropriate"} Uterine Fundus: {Desc; firm/soft:30687} Incision: {Exam; incision:21111123} DVT Evaluation: {Exam; dvt:2111122} Labs: Lab Results  Component Value Date   WBC 11.6 (H) 01/16/2024   HGB 11.4 (L) 01/16/2024   HCT 34.9 (L) 01/16/2024   MCV 86.8 01/16/2024   PLT 218 01/16/2024      Latest Ref Rng & Units 06/29/2023   11:42 AM  CMP  Glucose 70 - 99 mg/dL 73   BUN 6 - 20 mg/dL 7   Creatinine 7.82 - 9.56 mg/dL 2.13   Sodium 086 - 578 mmol/L 137   Potassium 3.5 - 5.2 mmol/L 4.0   Chloride 96 - 106 mmol/L 104   CO2 20 - 29 mmol/L 21   Calcium 8.7 - 10.2 mg/dL 9.2   Total Protein 6.0 - 8.5 g/dL 6.9   Total Bilirubin 0.0 - 1.2 mg/dL <4.6   Alkaline Phos 44 - 121 IU/L 67   AST 0 - 40 IU/L 11   ALT 0 - 32 IU/L 7    Edinburgh Score:     No data to display         No data recorded  After visit meds:  Allergies as of 01/17/2024   No Known Allergies   Med Rec must be completed prior to using this [facility]***        Discharge home in stable condition Infant Feeding: {Baby  feeding:23562} Infant Disposition:{CHL IP OB HOME WITH VHQION:62952} Discharge instruction: per After Visit Summary and Postpartum booklet. Activity: Advance as tolerated. Pelvic rest for 6 weeks.  Diet: {OB WUXL:24401027} Future Appointments:No future appointments. Follow up Visit: Message sent to [facility] 5/11  Please schedule this patient for a In person postpartum visit in 6 weeks with the following provider: Any provider. Additional Postpartum F/U:None  High risk pregnancy complicated by: hx DVT, h/o C/S Delivery mode:  VBAC, Spontaneous Anticipated Birth Control:  Condoms   01/17/2024 Rayma Calandra, DO

## 2024-01-17 NOTE — Lactation Note (Signed)
 This note was copied from a baby's chart. Lactation Consultation Note  Patient Name: Marilyn Howard [id number] Date: 01/17/2024 Age:28 hours Reason for consult: Initial assessment;Term Mom stated she hadn't been long BF the baby for 15 minutes. Mom stated she thought it might had been a little shallow d/t hurting some. Mom felt that she done better on the Rt. Than the Lt. Discussed positioning and cheeks to breast. Mom stated she mainly pumped and bottle fed her 1st child but she will do what ever the baby wants to do. Mom stated she will be chilled w/what ever works the best and isn't going to get stressed over it. Mom's feeding choice is BF/formula. Encouraged to BF before giving formula. Mom hasn't given any formula so far. Newborn feeding habits, behavior, STS, I&O, cheeks to breast, milk storage reviewed.  Mom asked RN to set up DEBP because she wanted it for extra stimulation and if she gets anything she will give it to the baby instead of the formula. Mom encouraged to feed baby 8-12 times/24 hours and with feeding cues.  Encouraged mom to call for assistance or questions. Praised mom for doing so good so far.   Maternal Data Does the patient have breastfeeding experience prior to this delivery?: Yes How long did the patient breastfeed?: several months  Feeding    LATCH Score                    Lactation Tools Discussed/Used    Interventions Interventions: Breast feeding basics reviewed;Education;LC Services brochure  Discharge    Consult Status Consult Status: Follow-up Date: 01/17/24 Follow-up type: In-patient    Samai Corea G 01/17/2024, 5:56 AM

## 2024-01-17 NOTE — Lactation Note (Signed)
 This note was copied from a baby's chart. Lactation Consultation Note  Patient Name: Marilyn Howard [id number] Date: 01/17/2024 Age:28 hours Reason for consult: Mother's request;Term Mom having difficulty latching. Mom stated baby will get on the breast then fall asleep. Mom stated it hurts to BF.  Noted baby has a very high palate. Maybe the nipple is getting pinched up in palate.  Mom has different types of bottles. Tried Dr. Bevin Bucks but baby chewing and then gagging on nipple. Tried another shorter nipple, baby didn't gag but she just chewed. Applied NS #20 and placed baby at the breast. Baby not doing anything. LC inserted formula into NS w/curve tip syringe. It was to much volume and baby gags on that. LC tried inserting curve tip syringe in corner of mouth and a little bot of formula baby took well. Explained to mom give baby a little bit of time, baby not quite 24 hrs old yet. Mom stated baby has been spitting up amniotic fluid some today. Mom has used DEBP and given some colostrum w/curve tip syringe. Suggested use gloved finger not just quirt milk into baby's mouth. Informed mom LC is here all night if she runs into more challenges.   Maternal Data Has patient been taught Hand Expression?: Yes  Feeding    LATCH Score Latch: Repeated attempts needed to sustain latch, nipple held in mouth throughout feeding, stimulation needed to elicit sucking reflex.  Audible Swallowing: None  Type of Nipple: Everted at rest and after stimulation  Comfort (Breast/Nipple): Filling, red/small blisters or bruises, mild/mod discomfort (sore)  Hold (Positioning): Full assist, staff holds infant at breast  LATCH Score: 4   Lactation Tools Discussed/Used Tools: Flanges;Pump Flange Size: 21 Breast pump type: Double-Electric Breast Pump Reason for Pumping: mom's request  Interventions Interventions: Breast feeding basics reviewed;Assisted with latch;Skin to skin;Breast massage;Hand  express;Breast compression;Adjust position;Support pillows;Position options;Expressed milk;DEBP;Education  Discharge    Consult Status Consult Status: Follow-up Date: 01/18/24 Follow-up type: In-patient    Wasif Simonich G 01/17/2024, 11:04 PM

## 2024-01-18 ENCOUNTER — Other Ambulatory Visit (HOSPITAL_COMMUNITY): Payer: Self-pay

## 2024-01-18 MED ORDER — ENOXAPARIN SODIUM 40 MG/0.4ML IJ SOSY
40.0000 mg | PREFILLED_SYRINGE | Freq: Every day | INTRAMUSCULAR | 0 refills | Status: AC
  Filled 2024-01-18: qty 16.8, 42d supply, fill #0
  Filled 2024-01-18: qty 12, 30d supply, fill #0

## 2024-01-18 MED ORDER — SENNOSIDES-DOCUSATE SODIUM 8.6-50 MG PO TABS
2.0000 | ORAL_TABLET | Freq: Every day | ORAL | 0 refills | Status: AC
  Filled 2024-01-18: qty 60, 30d supply, fill #0

## 2024-01-18 MED ORDER — IBUPROFEN 600 MG PO TABS
600.0000 mg | ORAL_TABLET | Freq: Four times a day (QID) | ORAL | 0 refills | Status: AC
  Filled 2024-01-18: qty 40, 10d supply, fill #0

## 2024-01-18 NOTE — Progress Notes (Signed)
 MOB was referred for history of depression/anxiety.  * Referral screened out by Clinical Social Worker because none of the following criteria appear to apply:  ~ History of anxiety/depression during this pregnancy, or of post-partum depression following prior delivery.  ~ Diagnosis of anxiety and/or depression within last 3 years  Per OB notes, MOB did not indicate any signs/symptoms during her pregnancy. Anxiety/Depression 2020  OR  * MOB's symptoms currently being treated with medication and/or therapy.  Please contact the Clinical Social Worker if needs arise, by [facility] request, or if MOB scores greater than 9/yes to question 10 on Edinburgh Postpartum Depression Screen.  Jenney Modest, Milinda Allen Clinical Social Worker [phone]

## 2024-01-18 NOTE — Lactation Note (Signed)
 This note was copied from a baby's chart. Lactation Consultation Note  Patient Name: Marilyn Howard [id number] Date: 01/18/2024 Age:28 hours, early D/C today  Reason for consult: Follow-up assessment;Exclusive pumping and bottle feeding;Term;Infant weight loss (2 % weight loss,) Per mom has a DEBP at home.  LC reviewed supply and demand, importance of consistent pumping around the clock 8-10 times a day both breast for 15 - 20 mins.  LC also discussed power pumping once a day over 60 mins to enhance establishing the milk supply well.  LC reviewed proper flange fit, engorgement prevention, and S/S of mastitis.  Mom has the [facility] resource sheet.   Maternal Data Has patient been taught Hand Expression?: Yes Does the patient have breastfeeding experience prior to this delivery?: Yes How long did the patient breastfeed?: per mom just pumped for 3-4 months without issues except milk supply  Feeding Mother's Current Feeding Choice: Breast Milk and Formula Nipple Type:  (per mom avent from home)    Lactation Tools Discussed/Used Tools: Pump Flange Size: 21 Breast pump type: Double-Electric Breast Pump Pump Education: Setup, frequency, and cleaning;Milk Storage Reason for Pumping: per mom plans to exclusivley pump  Interventions Interventions: Breast feeding basics reviewed;Hand pump;DEBP;Education;LC Services brochure;CDC milk storage guidelines;CDC Guidelines for Breast Pump Cleaning  Discharge Discharge Education: Engorgement and breast care;Warning signs for feeding baby Pump: Personal;DEBP (per mom lansinoh)  Consult Status Consult Status: Complete Date: 01/18/24    Renda Carpen Genevia Bouldin 01/18/2024, 10:05 AM

## 2024-01-23 ENCOUNTER — Encounter (HOSPITAL_COMMUNITY): Payer: Self-pay

## 2024-01-24 ENCOUNTER — Inpatient Hospital Stay (HOSPITAL_COMMUNITY)
Admission: AD | Admit: 2024-01-24 | Discharge: 2024-01-26 | DRG: 776 | Disposition: A | Discharge status: Home / Self Care | Attending: Family Medicine | Admitting: Family Medicine

## 2024-01-24 DIAGNOSIS — N390 Urinary tract infection, site not specified: Secondary | ICD-10-CM | POA: Diagnosis present

## 2024-01-24 DIAGNOSIS — O864 Pyrexia of unknown origin following delivery: Principal | ICD-10-CM

## 2024-01-24 DIAGNOSIS — R509 Fever, unspecified: Secondary | ICD-10-CM | POA: Diagnosis present

## 2024-01-24 DIAGNOSIS — Z86718 Personal history of other venous thrombosis and embolism: Secondary | ICD-10-CM

## 2024-01-24 DIAGNOSIS — O8612 Endometritis following delivery: Secondary | ICD-10-CM | POA: Diagnosis present

## 2024-01-24 DIAGNOSIS — O8620 Urinary tract infection following delivery, unspecified: Secondary | ICD-10-CM | POA: Diagnosis present

## 2024-01-24 DIAGNOSIS — Z87891 Personal history of nicotine dependence: Secondary | ICD-10-CM | POA: Diagnosis not present

## 2024-01-24 LAB — CBC WITH DIFFERENTIAL/PLATELET
Abs Immature Granulocytes: 0.09 10*3/uL — ABNORMAL HIGH (ref 0.00–0.07)
Basophils Absolute: 0 10*3/uL (ref 0.0–0.1)
Basophils Relative: 0 %
Eosinophils Absolute: 0 10*3/uL (ref 0.0–0.5)
Eosinophils Relative: 0 %
HCT: 26.9 % — ABNORMAL LOW (ref 36.0–46.0)
Hemoglobin: 8.9 g/dL — ABNORMAL LOW (ref 12.0–15.0)
Immature Granulocytes: 1 %
Lymphocytes Relative: 16 %
Lymphs Abs: 2 10*3/uL (ref 0.7–4.0)
MCH: 28.3 pg (ref 26.0–34.0)
MCHC: 33.1 g/dL (ref 30.0–36.0)
MCV: 85.7 fL (ref 80.0–100.0)
Monocytes Absolute: 1.1 10*3/uL — ABNORMAL HIGH (ref 0.1–1.0)
Monocytes Relative: 9 %
Neutro Abs: 9.1 10*3/uL — ABNORMAL HIGH (ref 1.7–7.7)
Neutrophils Relative %: 74 %
Platelets: 224 10*3/uL (ref 150–400)
RBC: 3.14 MIL/uL — ABNORMAL LOW (ref 3.87–5.11)
RDW: 13.7 % (ref 11.5–15.5)
WBC: 12.4 10*3/uL — ABNORMAL HIGH (ref 4.0–10.5)
nRBC: 0 % (ref 0.0–0.2)

## 2024-01-24 MED ORDER — SODIUM CHLORIDE 0.9 % IV SOLN: 2.0000 g | Freq: Four times a day (QID) | INTRAVENOUS | Status: DC

## 2024-01-24 MED ORDER — POLYETHYLENE GLYCOL 3350 17 G PO PACK
17.0000 g | PACK | Freq: Every day | ORAL | Status: DC | PRN
  Administered 2024-01-25: 17 g via ORAL
  Filled 2024-01-24: qty 1

## 2024-01-24 MED ORDER — BISACODYL 5 MG PO TBEC
5.0000 mg | DELAYED_RELEASE_TABLET | Freq: Every day | ORAL | Status: DC | PRN
  Administered 2024-01-24 – 2024-01-25 (×2): 5 mg via ORAL
  Filled 2024-01-24 (×2): qty 1

## 2024-01-24 MED ORDER — PRENATAL MULTIVITAMIN CH
1.0000 | ORAL_TABLET | Freq: Every day | ORAL | Status: DC
  Administered 2024-01-24 – 2024-01-25 (×2): 1 via ORAL
  Filled 2024-01-24 (×2): qty 1

## 2024-01-24 MED ORDER — ACETAMINOPHEN 325 MG PO TABS
650.0000 mg | ORAL_TABLET | Freq: Four times a day (QID) | ORAL | Status: DC | PRN
  Administered 2024-01-25: 650 mg via ORAL
  Filled 2024-01-24: qty 2

## 2024-01-24 MED ORDER — SIMETHICONE 80 MG PO CHEW: 80.0000 mg | CHEWABLE_TABLET | Freq: Four times a day (QID) | ORAL | Status: DC | PRN

## 2024-01-24 MED ORDER — IBUPROFEN 600 MG PO TABS
600.0000 mg | ORAL_TABLET | Freq: Four times a day (QID) | ORAL | Status: DC | PRN
  Administered 2024-01-24 – 2024-01-26 (×6): 600 mg via ORAL
  Filled 2024-01-24 (×6): qty 1

## 2024-01-24 MED ORDER — ONDANSETRON HCL 4 MG PO TABS: 4.0000 mg | ORAL_TABLET | Freq: Four times a day (QID) | ORAL | Status: DC | PRN

## 2024-01-24 MED ORDER — OXYCODONE-ACETAMINOPHEN 5-325 MG PO TABS
1.0000 | ORAL_TABLET | ORAL | Status: DC | PRN
  Administered 2024-01-24 (×2): 2 via ORAL
  Administered 2024-01-25: 1 via ORAL
  Administered 2024-01-25: 2 via ORAL
  Administered 2024-01-25: 1 via ORAL
  Filled 2024-01-24: qty 2
  Filled 2024-01-24: qty 1
  Filled 2024-01-24: qty 2
  Filled 2024-01-24: qty 1
  Filled 2024-01-24: qty 2

## 2024-01-24 MED ORDER — ENOXAPARIN SODIUM 40 MG/0.4ML IJ SOSY
40.0000 mg | PREFILLED_SYRINGE | INTRAMUSCULAR | Status: DC
  Administered 2024-01-24 – 2024-01-26 (×3): 40 mg via SUBCUTANEOUS
  Filled 2024-01-24 (×3): qty 0.4

## 2024-01-24 MED ORDER — SODIUM CHLORIDE 0.9 % IV SOLN: INTRAVENOUS | Status: AC

## 2024-01-24 MED ORDER — SODIUM CHLORIDE 0.9 % IV SOLN
3.0000 g | Freq: Four times a day (QID) | INTRAVENOUS | Status: DC
  Administered 2024-01-24 – 2024-01-26 (×10): 3 g via INTRAVENOUS
  Filled 2024-01-24 (×10): qty 8

## 2024-01-24 MED ORDER — CLINDAMYCIN PHOSPHATE 900 MG/50ML IV SOLN: 900.0000 mg | Freq: Three times a day (TID) | INTRAVENOUS | Status: DC

## 2024-01-24 MED ORDER — DOCUSATE SODIUM 100 MG PO CAPS
100.0000 mg | ORAL_CAPSULE | Freq: Two times a day (BID) | ORAL | Status: DC
  Administered 2024-01-24 – 2024-01-25 (×5): 100 mg via ORAL
  Filled 2024-01-24 (×6): qty 1

## 2024-01-24 MED ORDER — MORPHINE SULFATE (PF) 2 MG/ML IV SOLN: 1.0000 mg | INTRAVENOUS | Status: DC | PRN

## 2024-01-24 MED ORDER — ONDANSETRON HCL 4 MG/2ML IJ SOLN: 4.0000 mg | Freq: Four times a day (QID) | INTRAMUSCULAR | Status: DC | PRN

## 2024-01-24 NOTE — H&P (Addendum)
 Faculty Practice Obstetrics and Gynecology Attending History and Physical  Marilyn Howard is a 28 y.o. [id number] who presented to [facility] today for evaluation of postpartum fever.  She is s/p uncomplicated VBAC on 01/17/24. She had been doing well at home until yesterday she started to feel fatigued and have headaches. She reported having a fever as well. She presented to the outside ER where she had a temp of 101. She received zosyn, tylenol  and 2 L of IVF.  Reports that she had urinary retention after delivery and sometimes still feels like she isn't emptying her bladder. Noted foul smell to her urine in the ER but that was the first time. No burning.  She reports feeling much better since receiving fluids and antibiotics. She is breastfeeding. Denies breast redness or severe pain. Reports some mild tenderness.  Past Medical History:  Diagnosis Date   Anemia    DVT (deep venous thrombosis) (HCC) 2016   Heart murmur    Hip bursitis    Palpitations 03/05/2023   Past Surgical History:  Procedure Laterality Date   CESAREAN SECTION     TONSILLECTOMY     and adenoids   OB History  Gravida Para Term Preterm AB Living  2 2 2  0 0 2  SAB IAB Ectopic Multiple Live Births  0 0 0 0 2    # Outcome Date GA Lbr Len/2nd Weight Sex Type Anes PTL Lv  2 Term 01/17/24 [redacted]w[redacted]d / 02:02 3210 g F VBAC EPI  LIV  1 Term 11/06/19 [redacted]w[redacted]d    CS-LTranv   LIV  Patient denies any other pertinent gynecologic issues.  No current facility-administered medications on file prior to encounter.   Current Outpatient Medications on File Prior to Encounter  Medication Sig Dispense Refill   acetaminophen  (TYLENOL ) 500 MG tablet Take 500 mg by mouth every 6 (six) hours as needed for moderate pain.     Blood Pressure Monitoring (BLOOD PRESSURE KIT) DEVI 1 Device by Does not apply route once a week. 1 each 0   enoxaparin  (LOVENOX ) 40 MG/0.4ML injection Inject 0.4 mLs (40 mg total) into the skin at bedtime.  16.8 mL 0   ibuprofen  (ADVIL ) 600 MG tablet Take 1 tablet (600 mg total) by mouth every 6 (six) hours. 40 tablet 0   Prenatal Vit-Fe Fumarate-FA (MULTIVITAMIN-PRENATAL) 27-0.8 MG TABS tablet Take 1 tablet by mouth daily at 12 noon. Olly Gummies     senna-docusate (SENOKOT-S) 8.6-50 MG tablet Take 2 tablets by mouth daily. 60 tablet 0   No Known Allergies  Social History:   reports that she quit smoking about 9 years ago. Her smoking use included cigarettes. She has never used smokeless tobacco. She reports that she does not currently use alcohol. She reports that she does not currently use drugs. Family History  Problem Relation Age of Onset   Graves' disease Mother     Review of Systems: Pertinent items noted in HPI and remainder of comprehensive ROS otherwise negative.  PHYSICAL EXAM: Blood pressure (!) 111/51, pulse 96, temperature 99.1 F (37.3 C), temperature source Oral, resp. rate 18, last menstrual period 03/28/2023, SpO2 100%, unknown if currently breastfeeding. CONSTITUTIONAL: Well-developed, well-nourished female in no acute distress.  HENT:  Normocephalic, atraumatic, External right and left ear normal. Oropharynx is clear and moist PSYCHIATRIC: Normal mood and affect. Normal behavior. Normal judgment and thought content. BREASTS: bilateral breasts without erythema or tendnerness CARDIOVASCULAR: Normal heart rate noted, regular rhythm RESPIRATORY: Effort and breath sounds  normal, no problems with respiration noted ABDOMEN: Soft, nontender, nondistended. Uterine fundus firm below the umbilicus with mild tenderness PELVIC: deferred MUSCULOSKELETAL: Normal range of motion. No tenderness.  No cyanosis, clubbing, or edema.    Labs: Outside labs reviewed. Some noted below WBC 13.5 Hgb 10.6 Hct 32 Plts 243  UA with moderate blood, many bacteria, +LE, +nitrates,+WBCs  Imaging Studies: Pelvic ultrasound 01/23/24: Imaging Results - US  Pelvis Transabdominal With Transvag  (01/23/2024 7:07 PM EDT) Impressions  01/23/2024 8:15 PM EDT  1.  Scattered small volume of fluid along the lower uterine segment may be expected postpartum/postoperative fluid . No substantial increased vascularity of the endometrium to suggest retained products of conception. 2.  Mildly heterogeneous appearance of the myometrium could reflect postoperative/postpartum changes.     Imaging Results - US  Pelvis Transabdominal With Transvag (01/23/2024 7:07 PM EDT) Narrative  01/23/2024 8:15 PM EDT  US  PELVIS TRANSABDOMINAL WITH TRANSVAG, 01/23/2024 7:07 PM  INDICATION: endometritis, eval for retained products of conception COMPARISON: None.  TRANSABDOMINAL EXAM:  TECHNIQUE: Multi-planar real-time grayscale ultrasound of the pelvis via a transabdominal approach was performed, supplemented by color and/or power Doppler for limited purposes of general vascularity evaluation.  FINDINGS:  .  Uterus: Further characterization as below. .  Right ovary/adnexa: Unremarkable. More detailed characterization as discussed below. .  Left ovary/adnexa: Unremarkable. More detailed characterization as discussed below. .  Bladder: Unremarkable. .  Peritoneum: No significant fluid or other abnormality.  TRANSVAGINAL EXAM:  TECHNIQUE: Multi-planar real-time grayscale ultrasound of the pelvis via a transvaginal approach was performed, supplemented by color and/or power Doppler for limited purposes of general vascularity evaluation.  FINDINGS:  UTERUS: Neutral positioning. .  Size = 8.1 x 10.9 x 15.3 cm. Morphology .  Corpus:  No apparent suspicious parenchymal lesions. Diffusely increased vascularity involving the myometrium. Mildly heterogeneous in echogenicity. .  Endometrium: Width = 9 mm. Fluid along the lower segment. .  Cervix: Scattered amount of fluid.  RIGHT Adnexa: .  Ovary: Size = 2.2 x 4.1 x 4.0 cm. No apparent suspicious lesions. General vascularity appears preserved. .  Other: No  additional extra-ovarian abnormalities of note.  LEFT Adnexa: .  Ovary: Size = 2.0 x 3.1 x 3.9 cm. No apparent suspicious lesions. General vascularity appears preserved. .  Other: No additional extra-ovarian abnormalities of note.  Peritoneum: No fluid in the pelvic cul-de-sac. Slide test: Not performed. Other: None.    Assessment: Principal Problem:   Postpartum fever Active Problems:   History of DVT (deep vein thrombosis)   Urinary tract infection   Fever Suspected endometritis with additional UTI. Patient with some mild fundal tenderness, but exam is s/p initial dose of antibiotics. UA with signs of infection.  Plan: Unasyn Lovenox  ppx Pain meds prn  IVF Check postvoid residuals after next couple voids(154 ml after most recent void) to ensure not retaining.   Marci Setter, MD, FACOG Obstetrician & Gynecologist, [facility] for [facility], [facility]

## 2024-01-24 NOTE — Plan of Care (Signed)

## 2024-01-25 ENCOUNTER — Encounter (HOSPITAL_COMMUNITY): Payer: Self-pay | Admitting: Obstetrics and Gynecology

## 2024-01-25 ENCOUNTER — Other Ambulatory Visit: Payer: Self-pay

## 2024-01-25 DIAGNOSIS — O8612 Endometritis following delivery: Secondary | ICD-10-CM | POA: Diagnosis present

## 2024-01-25 LAB — URINE CULTURE: Culture: <10000 — AB

## 2024-01-25 MED ORDER — CYCLOBENZAPRINE HCL 10 MG PO TABS
10.0000 mg | ORAL_TABLET | Freq: Three times a day (TID) | ORAL | Status: DC | PRN
  Administered 2024-01-25 – 2024-01-26 (×2): 10 mg via ORAL
  Filled 2024-01-25 (×2): qty 1

## 2024-01-25 NOTE — Progress Notes (Signed)
 Gynecology Progress Note  Admission Date: 01/24/2024 Current Date: 01/25/2024 10:25 AM  Marilyn Howard is a 28 y.o. [id number] HD#2 admitted for postpartum fever/postpartum endometritis   History complicated by: Patient Active Problem List   Diagnosis Date Noted   Postpartum endometritis 01/25/2024   Postpartum fever 01/24/2024   Urinary tract infection 01/24/2024   Fever 01/24/2024   SVD (spontaneous vaginal delivery) 01/17/2024   History of cesarean delivery 08/24/2023   Abnormal ultrasound 08/24/2023   History of placenta abruption 06/29/2023   Supervision of high risk pregnancy, antepartum 06/11/2023   Polyarthralgia 12/21/2022   Hypersomnia 02/24/2022   Vitamin D deficiency 02/24/2022   GAD (generalized anxiety disorder) 05/04/2020   History of DVT (deep vein thrombosis) 07/04/2016    ROS and patient/family/surgical history, located on admission H&P note dated 01/24/2024, have been reviewed, and there are no changes except as noted below Yesterday/Overnight Events:  None significant  Subjective:  Pt seen.  She denies any fever/chills.  Pt notes abdominal and back pain somewhat improved since admission.  She did note some back pain and muscle stiffness  Objective:   Vitals:   01/24/24 2111 01/25/24 0022 01/25/24 0513 01/25/24 0932  BP:  131/73 101/67 108/73  Pulse: 81 77 71 78  Resp: 18 18 17 17   Temp: 99 F (37.2 C) 98.4 F (36.9 C) 98.2 F (36.8 C) 98.9 F (37.2 C)  TempSrc: Oral Oral Oral Oral  SpO2: 100% 100% 99% 100%    Temp:  [97.5 F (36.4 C)-99 F (37.2 C)] 98.9 F (37.2 C) (05/19 0932) Pulse Rate:  [71-81] 78 (05/19 0932) Resp:  [16-18] 17 (05/19 0932) BP: (101-131)/(63-73) 108/73 (05/19 0932) SpO2:  [99 %-100 %] 100 % (05/19 0932) I/O last 3 completed shifts: In: 5869.3 [P.O.:2520; I.V.:2865.4; IV Piggyback:483.9] Out: 4000 [Urine:4000] Total I/O In: -  Out: 400 [Urine:400]  Intake/Output Summary (Last 24 hours) at 01/25/2024 1025 Last data filed  at 01/25/2024 0800 Gross per 24 hour  Intake 5533.8 ml  Output 3800 ml  Net 1733.8 ml     Current Vital Signs 24h Vital Sign Ranges  T 98.9 F (37.2 C) Temp  Avg: 98.4 F (36.9 C)  Min: 97.5 F (36.4 C)  Max: 99 F (37.2 C)  BP 108/73 BP  Min: 101/67  Max: 131/73  HR 78 Pulse  Avg: 77.2  Min: 71  Max: 81  RR 17 Resp  Avg: 17  Min: 16  Max: 18  SaO2 100 % Room Air SpO2  Avg: 99.7 %  Min: 99 %  Max: 100 %       24 Hour I/O Current Shift I/O  Time Ins Outs 05/18 0701 - 05/19 0700 In: 5533.8 [P.O.:2520; I.V.:2629.9] Out: 3600 [Urine:3600] 05/19 0701 - 05/19 1900 In: -  Out: 400 [Urine:400]   Patient Vitals for the past 12 hrs:  BP Temp Temp src Pulse Resp SpO2  01/25/24 0932 108/73 98.9 F (37.2 C) Oral 78 17 100 %  01/25/24 0513 101/67 98.2 F (36.8 C) Oral 71 17 99 %  01/25/24 0022 131/73 98.4 F (36.9 C) Oral 77 18 100 %     Patient Vitals for the past 24 hrs:  BP Temp Temp src Pulse Resp SpO2  01/25/24 0932 108/73 98.9 F (37.2 C) Oral 78 17 100 %  01/25/24 0513 101/67 98.2 F (36.8 C) Oral 71 17 99 %  01/25/24 0022 131/73 98.4 F (36.9 C) Oral 77 18 100 %  01/24/24 2111 111/72 99 F (  37.2 C) Oral 81 18 100 %  01/24/24 1545 103/65 (!) 97.5 F (36.4 C) -- 77 16 100 %  01/24/24 1217 106/63 98.3 F (36.8 C) Oral 79 16 99 %    Physical exam: General appearance: alert, cooperative, appears stated age, and no distress Abdomen: soft, nondistended, mild suprapubic pain, no rebound or guarding GU: No gross VB Lungs: clear to auscultation bilaterally Heart: regular rate and rhythm Extremities: no lower extremity edema, no calf pain bilaterally Skin: normal , not diaphoretic Psych: appropriate Neurologic: Grossly normal  Medications Current Facility-Administered Medications  Medication Dose Route Frequency Provider Last Rate Last Admin   0.9 %  sodium chloride  infusion   Intravenous Continuous Marci Setter, MD 125 mL/hr at 01/25/24 0929 New Bag at 01/25/24  0929   acetaminophen  (TYLENOL ) tablet 650 mg  650 mg Oral Q6H PRN Marci Setter, MD       Ampicillin -Sulbactam (UNASYN ) 3 g in sodium chloride  0.9 % 100 mL IVPB  3 g Intravenous Q6H Marci Setter, MD 200 mL/hr at 01/25/24 0930 3 g at 01/25/24 0930   bisacodyl  (DULCOLAX) EC tablet 5 mg  5 mg Oral Daily PRN Wendelyn Halter, MD   5 mg at 01/24/24 0910   cyclobenzaprine  (FLEXERIL ) tablet 10 mg  10 mg Oral TID PRN Abigail Abler, MD       docusate sodium  (COLACE) capsule 100 mg  100 mg Oral BID Marci Setter, MD   100 mg at 01/25/24 1610   enoxaparin  (LOVENOX ) injection 40 mg  40 mg Subcutaneous Q24H Marci Setter, MD   40 mg at 01/25/24 0507   ibuprofen  (ADVIL ) tablet 600 mg  600 mg Oral Q6H PRN Marci Setter, MD   600 mg at 01/25/24 9604   morphine  (PF) 2 MG/ML injection 1-2 mg  1-2 mg Intravenous Q3H PRN Marci Setter, MD       ondansetron  (ZOFRAN ) tablet 4 mg  4 mg Oral Q6H PRN Marci Setter, MD       Or   ondansetron  (ZOFRAN ) injection 4 mg  4 mg Intravenous Q6H PRN Marci Setter, MD       oxyCODONE -acetaminophen  (PERCOCET/ROXICET) 5-325 MG per tablet 1-2 tablet  1-2 tablet Oral Q3H PRN Marci Setter, MD   1 tablet at 01/25/24 0924   polyethylene glycol (MIRALAX  / GLYCOLAX ) packet 17 g  17 g Oral Daily PRN Marci Setter, MD   17 g at 01/25/24 0019   prenatal multivitamin tablet 1 tablet  1 tablet Oral Q1200 Marci Setter, MD   1 tablet at 01/24/24 5409   simethicone  (MYLICON) chewable tablet 80 mg  80 mg Oral QID PRN Marci Setter, MD          Labs  Recent Labs  Lab 01/24/24 0424  WBC 12.4*  HGB 8.9*  HCT 26.9*  PLT 224      Radiology N/a  Assessment & Plan:  Postpartum endometritis versus UTI *Pain: currently minimal, pt states it is improved since admission *FEN/GI: tolerating regular diet *PPx: continue IV unasyn , consider switching to oral abx if still afebrile on 01/26/24 Recheck cbc tomorrow AM Code Status: Full Code   Avie Boeck, MD Attending Center for [facility])

## 2024-01-26 DIAGNOSIS — O864 Pyrexia of unknown origin following delivery: Secondary | ICD-10-CM

## 2024-01-26 LAB — CBC
HCT: 24.5 % — ABNORMAL LOW (ref 36.0–46.0)
Hemoglobin: 7.9 g/dL — ABNORMAL LOW (ref 12.0–15.0)
MCH: 28.5 pg (ref 26.0–34.0)
MCHC: 32.2 g/dL (ref 30.0–36.0)
MCV: 88.4 fL (ref 80.0–100.0)
Platelets: 226 10*3/uL (ref 150–400)
RBC: 2.77 MIL/uL — ABNORMAL LOW (ref 3.87–5.11)
RDW: 13.5 % (ref 11.5–15.5)
WBC: 7.4 10*3/uL (ref 4.0–10.5)
nRBC: 0 % (ref 0.0–0.2)

## 2024-01-26 MED ORDER — FERROUS SULFATE 325 (65 FE) MG PO TABS: 325.0000 mg | ORAL_TABLET | ORAL | 3 refills | Status: DC

## 2024-01-26 MED ORDER — AMOXICILLIN-POT CLAVULANATE 875-125 MG PO TABS: 1.0000 | ORAL_TABLET | Freq: Two times a day (BID) | ORAL | 1 refills | Status: AC

## 2024-01-26 NOTE — Discharge Summary (Addendum)
 Physician Discharge Summary  Patient ID: Marilyn Howard MRN: [id number] DOB/AGE: 1996/07/05 28 y.o.  Admit date: 01/24/2024 Discharge date: 01/26/2024  Admission Diagnoses:Postpartum endometritis  Discharge Diagnoses:  Principal Problem:   Postpartum fever Active Problems:   History of DVT (deep vein thrombosis)   Urinary tract infection   Fever   Postpartum endometritis   Discharged Condition: good  Hospital Course: Marilyn Howard is a 28 y.o. [id number] who presented to [facility] for evaluation of postpartum fever.   She is s/p uncomplicated VBAC on 01/17/24. She had been doing well at home until she started to feel fatigued and have headaches. She reported having a fever as well. She presented to the outside ER where she had a temp of 101. She received zosyn, tylenol  and 2 L of IVF.   Reports that she had urinary retention after delivery and sometimes still feels like she isn't emptying her bladder. Noted foul smell to her urine in the ER but that was the first time. No burning.   She reports feeling much better since receiving fluids and antibiotics. She is breastfeeding. Denies breast redness or severe pain. Reports some mild tenderness. She was admitted for IV antibiotics and remained afebrile for more than 48 hr and her WBC normalized.    Consults: None  Significant Diagnostic Studies: labs: CBC    Component Value Date/Time   WBC 7.4 01/26/2024 0356   RBC 2.77 (L) 01/26/2024 0356   HGB 7.9 (L) 01/26/2024 0356   HGB 10.7 (L) 10/30/2023 0921   HCT 24.5 (L) 01/26/2024 0356   HCT 32.7 (L) 10/30/2023 0921   PLT 226 01/26/2024 0356   PLT 199 10/30/2023 0921   MCV 88.4 01/26/2024 0356   MCV 90 10/30/2023 0921   MCH 28.5 01/26/2024 0356   MCHC 32.2 01/26/2024 0356   RDW 13.5 01/26/2024 0356   RDW 12.6 10/30/2023 0921   LYMPHSABS 2.0 01/24/2024 0424   LYMPHSABS 1.7 06/29/2023 1142   MONOABS 1.1 (H) 01/24/2024 0424   EOSABS 0.0 01/24/2024 0424   EOSABS 0.0  06/29/2023 1142   BASOSABS 0.0 01/24/2024 0424   BASOSABS 0.0 06/29/2023 1142    and microbiology: urine culture: negative except for insignificant growth  Treatments: IV hydration and antibiotics: Zosyn  Discharge Exam: Blood pressure (!) 141/67, pulse 69, temperature 98.8 F (37.1 C), temperature source Oral, resp. rate 17, last menstrual period 03/28/2023, SpO2 100%, unknown if currently breastfeeding. General appearance: alert, cooperative, and no distress Resp: effort normal Cardio: regular rate and rhythm GI: soft, non-tender; bowel sounds normal; no masses,  no organomegaly  Disposition: Discharge disposition: 01-Home or Self Care       Discharge Instructions     Discharge patient   Complete by: As directed    Discharge disposition: 01-Home or Self Care   Discharge patient date: 01/26/2024      Allergies as of 01/26/2024   No Known Allergies      Medication List     STOP taking these medications    acetaminophen  500 MG tablet Commonly known as: TYLENOL    Blood Pressure Kit Devi       TAKE these medications    amoxicillin-clavulanate 875-125 MG tablet Commonly known as: AUGMENTIN Take 1 tablet by mouth 2 (two) times daily.   enoxaparin  40 MG/0.4ML injection Commonly known as: LOVENOX  Inject 0.4 mLs (40 mg total) into the skin at bedtime.   ferrous sulfate 325 (65 FE) MG tablet Take 1 tablet (325 mg total) by  mouth every other day.   ibuprofen  600 MG tablet Commonly known as: ADVIL  Take 1 tablet (600 mg total) by mouth every 6 (six) hours.   multivitamin-prenatal 27-0.8 MG Tabs tablet Take 1 tablet by mouth daily at 12 noon. Olly Gummies   Senna-S 8.6-50 MG tablet Generic drug: senna-docusate Take 2 tablets by mouth daily.        Follow-up Information     [facility] for [facility] at [facility] Follow up in 4 week(s).   Specialty: Obstetrics and Gynecology Why: as scheduled Contact information: [address]  [address] [place]  [address] [phone]                Signed: Onnie Bilis 01/26/2024, 8:21 AM

## 2024-01-26 NOTE — Progress Notes (Signed)
 Discharge instructions and prescriptions given to pt. Discussed signs and symptoms to report to the MD, upcoming appointments, and meds. Pt verbalizes understanding and has no questions or concerns at this time. Pt discharged home from hospital I  stable condition.

## 2024-02-02 ENCOUNTER — Telehealth (HOSPITAL_COMMUNITY): Payer: Self-pay | Admitting: *Deleted

## 2024-02-02 NOTE — Telephone Encounter (Signed)
 02/02/2024  Name: Marilyn Howard MRN: [id number] DOB: 20-Mar-1996  Reason for Call:  Transition of Care Hospital Discharge Call  Contact Status: Patient Contact Status: Complete  Language assistant needed:          Follow-Up Questions: Do You Have Any Concerns About Your Health As You Heal From Delivery?: Yes What Concerns Do You Have About Your Health?: Patient stated, "I still feel like I'm having difficulty emptying my bladder. I'm not having any fever any more or other symptoms. I just can't empty my bladder." RN instructed patient to contact her OB to discuss her concerns. Patient verbalized understanding. No other questions or concerns voiced at this time. Do You Have Any Concerns About Your Infants Health?: No  Edinburgh Postnatal Depression Scale:  In the Past 7 Days: I have been able to laugh and see the funny side of things.: As much as I always could I have looked forward with enjoyment to things.: As much as I ever did I have blamed myself unnecessarily when things went wrong.: No, never I have been anxious or worried for no good reason.: Hardly ever I have felt scared or panicky for no good reason.: No, not much Things have been getting on top of me.: No, I have been coping as well as ever I have been so unhappy that I have had difficulty sleeping.: Not at all I have felt sad or miserable.: No, not at all I have been so unhappy that I have been crying.: No, never The thought of harming myself has occurred to me.: Never Edinburgh Postnatal Depression Scale Total: 2  PHQ2-9 Depression Scale:     Discharge Follow-up:    Post-discharge interventions: Reviewed Newborn Safe Sleep Practices  Signature Julien Odor, RN, 02/02/24, [phone]

## 2024-02-23 ENCOUNTER — Encounter: Payer: Self-pay | Admitting: Advanced Practice Midwife

## 2024-02-23 ENCOUNTER — Ambulatory Visit: Admitting: Advanced Practice Midwife

## 2024-02-23 VITALS — BP 124/87 | HR 86 | Ht 66.5 in | Wt 175.0 lb

## 2024-02-23 DIAGNOSIS — Z1331 Encounter for screening for depression: Secondary | ICD-10-CM | POA: Diagnosis not present

## 2024-02-23 DIAGNOSIS — Z3009 Encounter for other general counseling and advice on contraception: Secondary | ICD-10-CM | POA: Diagnosis not present

## 2024-02-23 DIAGNOSIS — Z86718 Personal history of other venous thrombosis and embolism: Secondary | ICD-10-CM

## 2024-02-23 DIAGNOSIS — Z98891 History of uterine scar from previous surgery: Secondary | ICD-10-CM

## 2024-02-23 NOTE — Progress Notes (Signed)
 Post Partum Visit Note  Marilyn Howard is a 28 y.o. [id number] female who presents for a postpartum visit. She is 5 weeks postpartum following a VBAC.  I have fully reviewed the prenatal and intrapartum course. The delivery was at 40 gestational weeks.  Anesthesia: epidural. Postpartum course has been well, since recent hospitalization. Baby is doing well. Baby is feeding by both breast and bottle - Enfamil reguline. Bleeding no bleeding. Bowel function is normal. Bladder function is normal. Patient is not sexually active. Contraception method is none. Postpartum depression screening: positive.   The pregnancy intention screening data noted above was reviewed. Potential methods of contraception were discussed. The patient elected to proceed with No data recorded.   Edinburgh Postnatal Depression Scale - 02/23/24 1521       Edinburgh Postnatal Depression Scale:  In the Past 7 Days   I have been able to laugh and see the funny side of things. 0    I have looked forward with enjoyment to things. 0    I have blamed myself unnecessarily when things went wrong. 1    I have been anxious or worried for no good reason. 3    I have felt scared or panicky for no good reason. 3    Things have been getting on top of me. 1    I have been so unhappy that I have had difficulty sleeping. 0    I have felt sad or miserable. 3    I have been so unhappy that I have been crying. 1    The thought of harming myself has occurred to me. 0    Edinburgh Postnatal Depression Scale Total 12          Health Maintenance Due  Topic Date Due   HPV VACCINES (1 - 3-dose series) Never done   COVID-19 Vaccine (1 - 2024-25 season) Never done    The following portions of the patient's history were reviewed and updated as appropriate: allergies, current medications, past family history, past medical history, past social history, past surgical history, and problem list.  Review of Systems Pertinent items noted in HPI  and remainder of comprehensive ROS otherwise negative.  Objective:  BP 124/87   Pulse 86   Ht 5' 6.5 (1.689 m)   Wt 175 lb (79.4 kg)   LMP 03/28/2023   Breastfeeding Yes   BMI 27.82 kg/m    VS reviewed, nursing note reviewed,  Constitutional: well developed, well nourished, no distress HEENT: normocephalic, thyroid without enlargement or mass HEART: RRR, no murmurs rubs/gallops RESP: clear and equal to auscultation bilaterally in all lobes  Breast Exam:  Deferred  Abdomen: soft Neuro: alert and oriented x 3 Skin: warm, dry Psych: affect normal Pelvic exam:On visual inspection, second degree perineal laceration fully healed, no edema, erythema and no visible sutures.  No hemorrhoid visualized but small area of skin that was likely a hemorrhoid after pushing that is healing.    Assessment:   1. Positive screening for depression on 9-item Patient Health Questionnaire (PHQ-9) (Primary) --Edinburgh score 12 --Offered counseling and pt to see [facility] for virtual visit --Had PPD last pregnancy and is doing better this time, despite being a single mom now.   --Additional stress related to her hospital admission for infection and feels like she has not had enough bonding time with baby --Letter provided for pt to extend maternity leave until 10 weeks PP  - Amb ref to Integrated Behavioral Health  2. Encounter  for counseling regarding contraception --Pt not sexually active currently. Does not want hormones (not a candidate for estrogen given hx DVT on OCPs).  Reviewed nonhormonal options and pt to make appt if/when she desires contraception.    3. Postpartum care following vaginal delivery --Doing well, bonding well with baby now that her medical conditions have improved, pumping and formula feeding is going well.   --At risk for PP depression and had hospital admission PP for endometritis so I have extended her requested maternity leave from 6 weeks to 10 weeks today.    4. History of  DVT (deep vein thrombosis) --On OCPs and as a smoker. Pt quit smoking.  Lovenox  in pregnancy and for 6 weeks.  --May stop Lovenox  now, follow up with PCP.    Plan:   Essential components of care per ACOG recommendations:  1.  Mood and well being: Patient with equivocal depression screening today. Reviewed local resources for support.  - Patient tobacco use? No.   - hx of drug use? No.    2. Infant care and feeding:  -Patient currently breastmilk feeding? Yes. Discussed returning to work and pumping.  -Social determinants of health (SDOH) reviewed in EPIC. No concerns   3. Sexuality, contraception and birth spacing --See above for contraceptive counseling  4. Sleep and fatigue -Encouraged family/partner/community support of 4 hrs of uninterrupted sleep to help with mood and fatigue  5. Physical Recovery  - Discussed patients delivery and complications. She describes her labor as good. - Patient had a successful VBAC but had a postpartum readmission for endometritis. Patient had a 2nd degree laceration. Perineal healing reviewed. Patient expressed understanding - Patient has urinary incontinence? No. - Patient is safe to resume physical and sexual activity  6.  Health Maintenance - HM due items addressed Yes - Last pap smear 05/16/2022 at [facility]. Pap smear not done at today's visit.  -Breast Cancer screening indicated? No.   7. Chronic Disease/Pregnancy Condition follow up: None  - PCP follow up  Arlester Bence, CNM [facility] [employer], [facility]

## 2024-02-23 NOTE — Progress Notes (Signed)
 Was hospitalized for 3 days d/t deviated bladder/uterus, developed infection. Edinburgh screen 12.

## 2024-03-01 ENCOUNTER — Encounter: Payer: Self-pay | Admitting: Advanced Practice Midwife

## 2024-03-01 ENCOUNTER — Ambulatory Visit: Admitting: Licensed Clinical Social Worker

## 2024-03-01 ENCOUNTER — Other Ambulatory Visit: Payer: Self-pay | Admitting: Advanced Practice Midwife

## 2024-03-01 DIAGNOSIS — O9081 Anemia of the puerperium: Secondary | ICD-10-CM

## 2024-03-01 DIAGNOSIS — F4321 Adjustment disorder with depressed mood: Secondary | ICD-10-CM

## 2024-03-01 MED ORDER — ACCRUFER 30 MG PO CAPS
1.0000 | ORAL_CAPSULE | Freq: Two times a day (BID) | ORAL | 3 refills | Status: AC
Start: 2024-03-01 — End: ?

## 2024-03-01 NOTE — BH Specialist Note (Signed)
 Integrated Behavioral Health via Telemedicine Visit   Marilyn Howard [id number]  Number of Integrated Behavioral Health Clinician visits: 1- Initial Visit  Session Start time: 1515   Session End time: 1611  Total time in minutes: 56    Referring Provider: Left[place]-Kirby Patient/Family location: Home [facility] Provider location: Remote Office.  All persons participating in visit: Patient and [facility] Types of Service: Individual psychotherapy and Video visit  I connected with Marilyn Howard and/or Marilyn Howard's patient via  Telephone or Video Enabled Telemedicine Application  (Video is Caregility application) and verified that I am speaking with the correct person using two identifiers. Discussed confidentiality: Yes   I discussed the limitations of telemedicine and the availability of in person appointments.  Discussed there is a possibility of technology failure and discussed alternative modes of communication if that failure occurs.  I discussed that engaging in this telemedicine visit, they consent to the provision of behavioral healthcare and the services will be billed under their insurance.  Patient and/or legal guardian expressed understanding and consented to Telemedicine visit: Yes   Presenting Concerns: Patient and/or family reports the following symptoms/concerns: postpartum depression. Difficulty balancing newborn and toddler.  Duration of problem: Months; Severity of problem: moderate  Patient and/or Family's Strengths/Protective Factors: Social connections, Concrete supports in place (healthy food, safe environments, etc.), Physical Health (exercise, healthy diet, medication compliance, etc.), and Parental Resilience  Goals Addressed: Patient will:  Reduce symptoms of: depression   Increase knowledge and/or ability of: coping skills and healthy habits   Demonstrate ability to: Increase healthy adjustment to current life circumstances and Increase adequate support systems  for patient/family  Progress towards Goals: Ongoing    Interventions: Interventions utilized:  Supportive Counseling, Psychoeducation and/or Health Education, and Supportive Reflection Standardized Assessments completed: Not Needed    Patient and/or Family Response: Patient was present for today's virtual behavioral health appointment. She reported being the mother of a 28-year-old and a 28-week-old infant and is currently navigating the emotional and practical challenges of becoming a newly single parent. Patient resides with her parents and is working on establishing a consistent routine for her children while prioritizing a sense of normalcy and stability at home. Her current focus is on rediscovering herself, maintaining emotional satisfaction, and finding joy amid this life transition. Patient shared a history of depression and anxiety dating back to adolescence, previously treated with Zoloft, and acknowledged experiencing postpartum depression and anxiety following both pregnancies. She has been using coping strategies such as connecting with friends, leaving the house regularly, and focusing on personal healing. Patient verbalized her ongoing affirmation, "Healthy baby, happy mommy," as a way to remind herself to care for her own well-being alongside her children's. She also discussed plans to return to work in August and is actively working on coordinating childcare, including initiating communication with the children's father regarding after-school support.  Clinical Assessment/Diagnosis  Adjustment disorder with depressed mood    Assessment: Patient currently experiencing emotional adjustment related to being a newly single mother of two young children while managing a history of postpartum depression and anxiety. She is actively working on rediscovering herself, maintaining her well-being, and planning for her return to work.   Patient may benefit from continued support of  behavioral health services.  Plan: Follow up with behavioral health clinician on : 03/08/24 Behavioral recommendations: Patient to continue using positive coping strategies, such as social support and affirmations, while monitoring for any increase in postpartum symptoms. Recommend establishing a self-care routine and maintaining  open communication with your support system, including coordinating parenting responsibilities with the children's father. Complete Edinburgh at follow up.  Referral(s): Integrated [employer] (In Clinic)  I discussed the assessment and treatment plan with the patient and/or parent/guardian. They were provided an opportunity to ask questions and all were answered. They agreed with the plan and demonstrated an understanding of the instructions.   They were advised to call back or seek an in-person evaluation if the symptoms worsen or if the condition fails to improve as anticipated.  Raelene Trew LITTIE Seats, LCSWA

## 2024-03-01 NOTE — Progress Notes (Signed)
 Rx changed from ferrous sulfate  every other day to Accrufer BID for better tolerance of iron/less constipation.

## 2024-03-08 ENCOUNTER — Ambulatory Visit: Payer: Self-pay | Admitting: Licensed Clinical Social Worker

## 2024-03-09 NOTE — BH Specialist Note (Signed)
 [facility] provided virtual link to patient on this date. [facility] contacted patient and left a message. Patient no showed for today's visit.

## 2024-03-21 ENCOUNTER — Telehealth: Payer: Self-pay | Admitting: Advanced Practice Midwife

## 2024-03-21 NOTE — Telephone Encounter (Signed)
 Called the pt to let her know that we rec'd her [employer] Short [employer] Disability Form and to see how she would like to pay for it.  Pt paid via phone $15.00 (VISA).  Form placed in nurse box for completion by the provider.

## 2024-08-10 ENCOUNTER — Other Ambulatory Visit: Payer: Self-pay | Admitting: Obstetrics & Gynecology
